# Patient Record
Sex: Female | Born: 1955 | Race: White | Hispanic: No | Marital: Married | Smoking: Former smoker
Health system: Southern US, Community
[De-identification: ages and names within clinical notes are randomized; demographics above are authoritative.]

## PROBLEM LIST (undated history)

## (undated) DIAGNOSIS — E739 Lactose intolerance, unspecified: Secondary | ICD-10-CM

## (undated) DIAGNOSIS — R896 Abnormal cytological findings in specimens from other organs, systems and tissues: Secondary | ICD-10-CM

## (undated) DIAGNOSIS — M810 Age-related osteoporosis without current pathological fracture: Secondary | ICD-10-CM

## (undated) DIAGNOSIS — IMO0001 Reserved for inherently not codable concepts without codable children: Secondary | ICD-10-CM

## (undated) DIAGNOSIS — E079 Disorder of thyroid, unspecified: Secondary | ICD-10-CM

## (undated) DIAGNOSIS — R42 Dizziness and giddiness: Secondary | ICD-10-CM

## (undated) DIAGNOSIS — J329 Chronic sinusitis, unspecified: Secondary | ICD-10-CM

## (undated) HISTORY — PX: DILATION AND CURETTAGE OF UTERUS: SHX78

## (undated) HISTORY — DX: Dizziness and giddiness: R42

## (undated) HISTORY — DX: Abnormal cytological findings in specimens from other organs, systems and tissues: R89.6

## (undated) HISTORY — DX: Disorder of thyroid, unspecified: E07.9

## (undated) HISTORY — DX: Reserved for inherently not codable concepts without codable children: IMO0001

## (undated) HISTORY — DX: Lactose intolerance, unspecified: E73.9

## (undated) HISTORY — PX: NASAL SINUS SURGERY: SHX719

## (undated) HISTORY — PX: BUNIONECTOMY: SHX129

## (undated) HISTORY — DX: Age-related osteoporosis without current pathological fracture: M81.0

## (undated) HISTORY — PX: HYSTEROSCOPY: SHX211

## (undated) HISTORY — DX: Chronic sinusitis, unspecified: J32.9

## (undated) HISTORY — PX: PELVIC LAPAROSCOPY: SHX162

---

## 2000-04-07 ENCOUNTER — Encounter: Admission: RE | Admit: 2000-04-07 | Discharge: 2000-04-07 | Payer: Self-pay | Admitting: Internal Medicine

## 2000-04-07 ENCOUNTER — Encounter: Payer: Self-pay | Admitting: Internal Medicine

## 2001-04-07 ENCOUNTER — Encounter: Admission: RE | Admit: 2001-04-07 | Discharge: 2001-04-07 | Payer: Self-pay | Admitting: Internal Medicine

## 2001-04-07 ENCOUNTER — Encounter: Payer: Self-pay | Admitting: Internal Medicine

## 2001-04-09 ENCOUNTER — Encounter: Admission: RE | Admit: 2001-04-09 | Discharge: 2001-04-09 | Payer: Self-pay | Admitting: Internal Medicine

## 2001-04-09 ENCOUNTER — Encounter: Payer: Self-pay | Admitting: Internal Medicine

## 2001-06-03 ENCOUNTER — Observation Stay (HOSPITAL_COMMUNITY): Admission: RE | Admit: 2001-06-03 | Discharge: 2001-06-04 | Payer: Self-pay

## 2001-06-03 ENCOUNTER — Encounter (INDEPENDENT_AMBULATORY_CARE_PROVIDER_SITE_OTHER): Payer: Self-pay | Admitting: Specialist

## 2002-04-05 ENCOUNTER — Encounter (INDEPENDENT_AMBULATORY_CARE_PROVIDER_SITE_OTHER): Payer: Self-pay

## 2002-04-05 ENCOUNTER — Ambulatory Visit (HOSPITAL_COMMUNITY): Admission: RE | Admit: 2002-04-05 | Discharge: 2002-04-05 | Payer: Self-pay | Admitting: Gastroenterology

## 2002-05-02 ENCOUNTER — Encounter: Admission: RE | Admit: 2002-05-02 | Discharge: 2002-05-02 | Payer: Self-pay | Admitting: Internal Medicine

## 2002-05-02 ENCOUNTER — Encounter: Payer: Self-pay | Admitting: Internal Medicine

## 2002-05-03 ENCOUNTER — Other Ambulatory Visit: Admission: RE | Admit: 2002-05-03 | Discharge: 2002-05-03 | Payer: Self-pay | Admitting: Obstetrics and Gynecology

## 2003-05-03 ENCOUNTER — Encounter: Admission: RE | Admit: 2003-05-03 | Discharge: 2003-05-03 | Payer: Self-pay | Admitting: Internal Medicine

## 2003-05-03 ENCOUNTER — Encounter: Payer: Self-pay | Admitting: Internal Medicine

## 2003-06-09 ENCOUNTER — Other Ambulatory Visit: Admission: RE | Admit: 2003-06-09 | Discharge: 2003-06-09 | Payer: Self-pay | Admitting: Obstetrics and Gynecology

## 2004-05-08 ENCOUNTER — Encounter: Admission: RE | Admit: 2004-05-08 | Discharge: 2004-05-08 | Payer: Self-pay | Admitting: Obstetrics and Gynecology

## 2004-06-12 ENCOUNTER — Other Ambulatory Visit: Admission: RE | Admit: 2004-06-12 | Discharge: 2004-06-12 | Payer: Self-pay | Admitting: Obstetrics and Gynecology

## 2005-05-13 ENCOUNTER — Encounter: Admission: RE | Admit: 2005-05-13 | Discharge: 2005-05-13 | Payer: Self-pay | Admitting: Obstetrics and Gynecology

## 2005-07-02 ENCOUNTER — Other Ambulatory Visit: Admission: RE | Admit: 2005-07-02 | Discharge: 2005-07-02 | Payer: Self-pay | Admitting: Obstetrics and Gynecology

## 2006-05-14 ENCOUNTER — Encounter: Admission: RE | Admit: 2006-05-14 | Discharge: 2006-05-14 | Payer: Self-pay | Admitting: Obstetrics and Gynecology

## 2006-07-08 ENCOUNTER — Other Ambulatory Visit: Admission: RE | Admit: 2006-07-08 | Discharge: 2006-07-08 | Payer: Self-pay | Admitting: Obstetrics and Gynecology

## 2007-05-25 ENCOUNTER — Encounter: Admission: RE | Admit: 2007-05-25 | Discharge: 2007-05-25 | Payer: Self-pay | Admitting: Obstetrics and Gynecology

## 2007-07-15 ENCOUNTER — Other Ambulatory Visit: Admission: RE | Admit: 2007-07-15 | Discharge: 2007-07-15 | Payer: Self-pay | Admitting: Obstetrics and Gynecology

## 2007-08-19 ENCOUNTER — Other Ambulatory Visit: Admission: RE | Admit: 2007-08-19 | Discharge: 2007-08-19 | Payer: Self-pay | Admitting: Obstetrics and Gynecology

## 2008-01-07 ENCOUNTER — Other Ambulatory Visit: Admission: RE | Admit: 2008-01-07 | Discharge: 2008-01-07 | Payer: Self-pay | Admitting: Obstetrics and Gynecology

## 2008-05-25 ENCOUNTER — Encounter: Admission: RE | Admit: 2008-05-25 | Discharge: 2008-05-25 | Payer: Self-pay | Admitting: Obstetrics and Gynecology

## 2008-07-20 ENCOUNTER — Ambulatory Visit: Payer: Self-pay | Admitting: Obstetrics and Gynecology

## 2008-07-20 ENCOUNTER — Other Ambulatory Visit: Admission: RE | Admit: 2008-07-20 | Discharge: 2008-07-20 | Payer: Self-pay | Admitting: Obstetrics and Gynecology

## 2008-07-20 ENCOUNTER — Encounter: Payer: Self-pay | Admitting: Obstetrics and Gynecology

## 2008-08-08 ENCOUNTER — Ambulatory Visit: Payer: Self-pay | Admitting: Obstetrics and Gynecology

## 2009-05-28 ENCOUNTER — Encounter: Admission: RE | Admit: 2009-05-28 | Discharge: 2009-05-28 | Payer: Self-pay | Admitting: Obstetrics and Gynecology

## 2009-08-02 ENCOUNTER — Other Ambulatory Visit: Admission: RE | Admit: 2009-08-02 | Discharge: 2009-08-02 | Payer: Self-pay | Admitting: Obstetrics and Gynecology

## 2009-08-02 ENCOUNTER — Ambulatory Visit: Payer: Self-pay | Admitting: Obstetrics and Gynecology

## 2009-11-30 ENCOUNTER — Ambulatory Visit: Payer: Self-pay | Admitting: Obstetrics and Gynecology

## 2010-05-30 ENCOUNTER — Encounter: Admission: RE | Admit: 2010-05-30 | Discharge: 2010-05-30 | Payer: Self-pay | Admitting: Obstetrics and Gynecology

## 2010-08-07 ENCOUNTER — Other Ambulatory Visit: Admission: RE | Admit: 2010-08-07 | Discharge: 2010-08-07 | Payer: Self-pay | Admitting: Obstetrics and Gynecology

## 2010-08-07 ENCOUNTER — Ambulatory Visit: Payer: Self-pay | Admitting: Obstetrics and Gynecology

## 2010-10-10 ENCOUNTER — Ambulatory Visit: Payer: Self-pay | Admitting: Obstetrics and Gynecology

## 2010-11-22 ENCOUNTER — Ambulatory Visit
Admission: RE | Admit: 2010-11-22 | Discharge: 2010-11-22 | Payer: Self-pay | Source: Home / Self Care | Attending: Obstetrics and Gynecology | Admitting: Obstetrics and Gynecology

## 2011-03-14 NOTE — Op Note (Signed)
[facility]  Patient:    Carol Mata, Carol Mata                     MRN: [id number] Proc. Date: 06/03/01 Adm. Date:  [id number] Attending:  Sharon Mt                           Operative Report  PREOPERATIVE DIAGNOSES:       1. Right adnexal mass.                               2. Endometrial polyps.  POSTOPERATIVE DIAGNOSES:      1. Cyst of right fallopian tube.                               2. Endometrial polyp.  OPERATION:                    Diagnostic laparoscopy with right                               salpingectomy followed by hysteroscopy,                               dilatation and curettage.  SURGEON:                      Daniel L. Eda Paschal, M.D.  ASSISTANTGaetano Hawthorne. Lily Peer, M.D.  ANESTHESIA:                   General endotracheal.  INDICATIONS:                  The patient is a 55 year old female who went to her internist for a yearly exam and because of her concern about ovarian cancer, went ahead and had an ultrasound done. This was done on June 12. It was read by Dr. Gery Pray as an irregular soft tissue mass emanating from the right ovary surrounded by fluid with internal blood flow present. He was concerned about a malignancy of the ovary. Several days later, she had to return to him for a mammogram and he went ahead re-ultrasounded her. At this time, he felt that it looked more like it was her right fallopian tube. It looked like she probably had a hydrosalpinx or salpingitis but what was disconcerting was that there was both solid and cystic components to it. She was treated with two weeks of antibiotics. She underwent two additional scans, one on July 12, one on August 5, and she continued to have an enlarged mass on the right side of 36 x 8 x 11 mm, increased vascular flow. The mass was near the distal end of what appeared to be a fallopian tube with fingerlike projections. It clearly was significantly  enlarged from a normal fallopian tube and because it was not a pure hydrosalpinx filled with fluid but had solid-like projections, it was felt that it should be removed. As a result of that, she now enters the hospital for diagnostic laparoscopy with either right salpingectomy or right salpingo-oophorectomy. She has had an  endometrial polyp seen on ultrasound on two occasions and she will also undergo hysteroscopy and excision of these endometrial polyp or polyps.  FINDINGS:                     At the time of laparoscopy, the patients uterus was normal size and shape. Left fallopian tube and ovary were normal. Right ovary was normal. The right fallopian tube appeared to be somewhat distended near the distal end although to the surgeons eyes, it was not anywhere near as impressive as it had been on the four ultrasounds that she had undergone. Ileocecal junction was identified. Appendix was normal. Right upper quadrant was visualized and was likewise normal. Once the fallopian tube had been removed, there was a cyst within the distal end of the tube which appeared to be starting to collapse. There were also some white excrescences near the fimbria of the right fallopian tube. At the time of hysteroscopy, the patient had two endometrial polyps, both were approximately 1 cm. They came off the right and left lateral wall. After they had been removed, the top of fundus, tubal cornua, anterior and posterior walls of the fundus, lower uterine segment, and the cervical canal were normal.  DESCRIPTION OF PROCEDURE:     After adequate general endotracheal anesthesia, the patient was placed in the dorsal lithotomy position, prepped and draped in the usual sterile manner. A pneumoperitoneum was created subumbilically with the ______ needle. A 10 mm port was placed there. The operating laparoscope was placed through the port. Two 5 mm ports were placed in the pelvis, one in the midline and one in the  left lower quadrant. Copious peritoneal washings were obtained, and then the pelvis was systematically evaluated. Both surgeons were at least somewhat puzzled by no obvious pelvic mass on the right which corresponded to what we were seeing on ultrasound, but since everything appeared normal, except for the slight bulge of the tube, and the fact that on ultrasound reports the mass had always been between the ovary and the uterus, it was felt that a right salpingectomy should be done. This was done using bipolar coagulation to control bleeding in the mesosalpinx and a scalpel attached to the bipolar to cut. The tube was completely excised. It was removed through the subumbilical incision and sent for frozen section. Frozen section confirmed that there was a cyst in the distal portion of the tube which explained all the above, and it was benign. It appeared to be slightly collapsed and whether that had happened during the surgery or was happening anyway, was hard to be sure. Everything appeared to be benign. The laparoscopic procedure was therefore terminated. All cul-de-sac fluid was removed. The pneumoperitoneum was evacuated. The subumbilical incision was closed with 0 Vicryl. The three skin incisions were closed with Monocryl. The surgeon regloved and draped and attention was turned to the vagina.  A single-tooth tenaculum was placed on the anterior lip of the cervix. The cervix was dilated to a #29 Pratt dilator. A hysteroscopic examination was done with the diagnostic scope using 3% Sorbitol to expand the intrauterine cavity. Two polyps were identified. They were removed with sharp curettage along with some endometrium, and this was also sent to pathology for tissue diagnosis. The patient was re-hysteroscoped and all the polyps had been removed. The procedure was therefore terminated. Pictures were taken both laparoscopically and hysteroscopically for documentation. Fluid deficit  from the hysteroscopy was 40 cc with none replaced. The patient tolerated  the procedure well and left the operating room in satisfactory condition. It should be noted that prior to starting the laparoscopic procedure, her bladder  had been emptied with a Robinson catheter. DD:  06/03/01 TD:  06/04/01 Job: [id number]

## 2011-05-01 ENCOUNTER — Other Ambulatory Visit: Payer: Self-pay | Admitting: Obstetrics and Gynecology

## 2011-05-01 DIAGNOSIS — Z1231 Encounter for screening mammogram for malignant neoplasm of breast: Secondary | ICD-10-CM

## 2011-05-22 ENCOUNTER — Other Ambulatory Visit (INDEPENDENT_AMBULATORY_CARE_PROVIDER_SITE_OTHER): Payer: BC Managed Care – PPO | Admitting: Gynecology

## 2011-05-22 DIAGNOSIS — E559 Vitamin D deficiency, unspecified: Secondary | ICD-10-CM

## 2011-05-23 LAB — VITAMIN D 25 HYDROXY (VIT D DEFICIENCY, FRACTURES): Vit D, 25-Hydroxy: 29 ng/mL — ABNORMAL LOW (ref 30–89)

## 2011-06-03 ENCOUNTER — Ambulatory Visit
Admission: RE | Admit: 2011-06-03 | Discharge: 2011-06-03 | Disposition: A | Discharge status: Home / Self Care | Payer: BC Managed Care – PPO | Source: Ambulatory Visit | Attending: Obstetrics and Gynecology | Admitting: Obstetrics and Gynecology

## 2011-06-03 DIAGNOSIS — Z1231 Encounter for screening mammogram for malignant neoplasm of breast: Secondary | ICD-10-CM

## 2011-08-12 ENCOUNTER — Encounter: Payer: Self-pay | Admitting: Gynecology

## 2011-08-12 DIAGNOSIS — N952 Postmenopausal atrophic vaginitis: Secondary | ICD-10-CM | POA: Insufficient documentation

## 2011-08-12 DIAGNOSIS — IMO0001 Reserved for inherently not codable concepts without codable children: Secondary | ICD-10-CM | POA: Insufficient documentation

## 2011-08-12 DIAGNOSIS — E079 Disorder of thyroid, unspecified: Secondary | ICD-10-CM | POA: Insufficient documentation

## 2011-08-21 ENCOUNTER — Ambulatory Visit (INDEPENDENT_AMBULATORY_CARE_PROVIDER_SITE_OTHER): Payer: BC Managed Care – PPO | Admitting: Obstetrics and Gynecology

## 2011-08-21 ENCOUNTER — Other Ambulatory Visit (HOSPITAL_COMMUNITY)
Admission: RE | Admit: 2011-08-21 | Discharge: 2011-08-21 | Disposition: A | Discharge status: Home / Self Care | Payer: BC Managed Care – PPO | Source: Ambulatory Visit | Attending: Obstetrics and Gynecology | Admitting: Obstetrics and Gynecology

## 2011-08-21 ENCOUNTER — Encounter: Payer: Self-pay | Admitting: Obstetrics and Gynecology

## 2011-08-21 VITALS — BP 112/64 | Ht 67.0 in | Wt 119.0 lb

## 2011-08-21 DIAGNOSIS — Z01419 Encounter for gynecological examination (general) (routine) without abnormal findings: Secondary | ICD-10-CM

## 2011-08-21 DIAGNOSIS — M949 Disorder of cartilage, unspecified: Secondary | ICD-10-CM

## 2011-08-21 DIAGNOSIS — M8580 Other specified disorders of bone density and structure, unspecified site: Secondary | ICD-10-CM

## 2011-08-21 DIAGNOSIS — Z13220 Encounter for screening for lipoid disorders: Secondary | ICD-10-CM

## 2011-08-21 DIAGNOSIS — E039 Hypothyroidism, unspecified: Secondary | ICD-10-CM

## 2011-08-21 DIAGNOSIS — M899 Disorder of bone, unspecified: Secondary | ICD-10-CM

## 2011-08-21 DIAGNOSIS — R823 Hemoglobinuria: Secondary | ICD-10-CM

## 2011-08-21 MED ORDER — LEVOTHYROXINE SODIUM 50 MCG PO TABS: 50.0000 ug | ORAL_TABLET | Freq: Every day | ORAL | Status: DC

## 2011-08-21 NOTE — Progress Notes (Signed)
Patient came to see me today for her annual GYN exam. She is doing well after menopause without symptoms. She's had no vaginal bleeding and no pelvic pain. She is up-to-date on mammograms. Her last bone density was in January of 2012. She continues with low bone mass without an elevated FRAX risk. Her vitamin D level was very low at 14 and we had placed her on 1000 IUs of vitamin D daily. Although she doesn't take it religiously her vitamin D level was 29 in July. We are also treating her for hypothyroidism. She feels normal on 50 MCG's of Synthroid. Her TSH last year with borderline between 5 and 6. She elected however not increase her dose. She also was treated with Vagifem previously. She no longer takes it and feels she is fine without it.  ROS: 12 system review done. Pertinent positives above.  Physical examination: [name] present. HEENT within normal limits. Neck: Thyroid not large. No masses. Supraclavicular nodes: not enlarged. Breasts: Examined in both sitting midline position. No skin changes and no masses. Abdomen: Soft no guarding rebound or masses or hernia. Pelvic: External: Within normal limits. BUS: Within normal limits. Vaginal:within normal limits. Poor estrogen effect. No evidence of cystocele rectocele or enterocele. Cervix: clean. Uterus: Normal size and shape. Adnexa: No masses. Rectovaginal exam: Confirmatory and negative. Extremities: Within normal limits.  Assessment: #1. Hypothyroidism #2. Atrophic vaginitis #3. Osteopenia #4. Vitamin D deficiency  Plan: If TSH stable we'll not increase Synthroid. Patient will take her vitamin D faithfully. We will recheck her vitamin D in one year.

## 2011-09-15 ENCOUNTER — Other Ambulatory Visit: Payer: Self-pay

## 2011-09-15 ENCOUNTER — Telehealth: Payer: Self-pay

## 2011-09-15 MED ORDER — FLUTICASONE PROPIONATE 50 MCG/ACT NA SUSP: 2.0000 | NASAL | Status: DC | PRN

## 2011-09-15 NOTE — Telephone Encounter (Signed)
PT. REQUESTING A NOTE FROM YOU THAT SHE HAD HER PHYSICAL WITH YOU ON 08-21-11. IT ID REQUIRED BY HER INS. CO. AND WANT SUS TO FAX IT TO HER AT [phone].

## 2011-09-15 NOTE — Telephone Encounter (Signed)
Please write note and I will sign. Thank you.

## 2011-09-15 NOTE — Telephone Encounter (Signed)
KATHY DID LETTER, PRINTED IT AND I FAXED TO # BELOW AND NOTIFIED PT.

## 2011-11-11 ENCOUNTER — Other Ambulatory Visit: Payer: Self-pay | Admitting: *Deleted

## 2011-11-11 MED ORDER — ZOLPIDEM TARTRATE 5 MG PO TABS: 5.0000 mg | ORAL_TABLET | Freq: Every evening | ORAL | Status: DC | PRN

## 2011-11-11 NOTE — Telephone Encounter (Signed)
rx called in

## 2011-12-26 ENCOUNTER — Telehealth: Payer: Self-pay | Admitting: *Deleted

## 2011-12-26 NOTE — Telephone Encounter (Signed)
Pt called requesting for her aex labs and date to fill out her health assessment form for her job. Results given over the phone to pt.

## 2012-05-07 ENCOUNTER — Telehealth: Payer: Self-pay | Admitting: *Deleted

## 2012-05-07 MED ORDER — ZOLPIDEM TARTRATE 5 MG PO TABS: 5.0000 mg | ORAL_TABLET | Freq: Every evening | ORAL | Status: DC | PRN

## 2012-05-14 ENCOUNTER — Other Ambulatory Visit: Payer: Self-pay | Admitting: Obstetrics and Gynecology

## 2012-05-14 DIAGNOSIS — Z1231 Encounter for screening mammogram for malignant neoplasm of breast: Secondary | ICD-10-CM

## 2012-06-04 ENCOUNTER — Ambulatory Visit
Admission: RE | Admit: 2012-06-04 | Discharge: 2012-06-04 | Disposition: A | Discharge status: Home / Self Care | Payer: BC Managed Care – PPO | Source: Ambulatory Visit | Attending: Obstetrics and Gynecology | Admitting: Obstetrics and Gynecology

## 2012-06-04 DIAGNOSIS — Z1231 Encounter for screening mammogram for malignant neoplasm of breast: Secondary | ICD-10-CM

## 2012-06-30 ENCOUNTER — Other Ambulatory Visit: Payer: Self-pay | Admitting: *Deleted

## 2012-07-01 MED ORDER — ZOLPIDEM TARTRATE 5 MG PO TABS: 5.0000 mg | ORAL_TABLET | Freq: Every evening | ORAL | Status: DC | PRN

## 2012-08-23 ENCOUNTER — Encounter: Payer: BC Managed Care – PPO | Admitting: Obstetrics and Gynecology

## 2012-08-24 ENCOUNTER — Other Ambulatory Visit (HOSPITAL_COMMUNITY)
Admission: RE | Admit: 2012-08-24 | Discharge: 2012-08-24 | Disposition: A | Discharge status: Home / Self Care | Payer: BC Managed Care – PPO | Source: Ambulatory Visit | Attending: Obstetrics and Gynecology | Admitting: Obstetrics and Gynecology

## 2012-08-24 ENCOUNTER — Ambulatory Visit (INDEPENDENT_AMBULATORY_CARE_PROVIDER_SITE_OTHER): Payer: BC Managed Care – PPO | Admitting: Obstetrics and Gynecology

## 2012-08-24 ENCOUNTER — Encounter: Payer: Self-pay | Admitting: Obstetrics and Gynecology

## 2012-08-24 VITALS — BP 110/70 | Ht 67.0 in | Wt 120.0 lb

## 2012-08-24 DIAGNOSIS — Z01419 Encounter for gynecological examination (general) (routine) without abnormal findings: Secondary | ICD-10-CM

## 2012-08-24 DIAGNOSIS — R42 Dizziness and giddiness: Secondary | ICD-10-CM | POA: Insufficient documentation

## 2012-08-24 DIAGNOSIS — M8580 Other specified disorders of bone density and structure, unspecified site: Secondary | ICD-10-CM

## 2012-08-24 DIAGNOSIS — M949 Disorder of cartilage, unspecified: Secondary | ICD-10-CM

## 2012-08-24 DIAGNOSIS — E039 Hypothyroidism, unspecified: Secondary | ICD-10-CM

## 2012-08-24 DIAGNOSIS — M899 Disorder of bone, unspecified: Secondary | ICD-10-CM

## 2012-08-24 LAB — CBC WITH DIFFERENTIAL/PLATELET
Basophils Relative: 0 % (ref 0–1)
Eosinophils Relative: 0 % (ref 0–5)
Hemoglobin: 13.3 g/dL (ref 12.0–15.0)
MCH: 29.4 pg (ref 26.0–34.0)
Monocytes Relative: 9 % (ref 3–12)
RBC: 4.53 MIL/uL (ref 3.87–5.11)
WBC: 4.8 10*3/uL (ref 4.0–10.5)

## 2012-08-24 MED ORDER — FLUTICASONE PROPIONATE 50 MCG/ACT NA SUSP: 2.0000 | NASAL | Status: DC | PRN

## 2012-08-24 MED ORDER — LEVOTHYROXINE SODIUM 50 MCG PO TABS: 50.0000 ug | ORAL_TABLET | Freq: Every day | ORAL | Status: DC

## 2012-08-24 NOTE — Patient Instructions (Signed)
Scheduled bone density for 2014 in January. Continue yearly mammograms. Continue colonoscopies as per Dr. Kinnie Scales.

## 2012-08-24 NOTE — Progress Notes (Signed)
Patient came to see me today for her annual GYN exam. She is doing well without hormone replacement therapy. For a while she used  Vagifem for atrophic vaginitis. She stopped it and the problem has not reoccurred. She is having no vaginal bleeding. She is having no pelvic pain. In 2002 she had a diagnostic laparoscopy with right salpingectomy for right paratubal cyst which was benign. At the same time she had a hysteroscopy with removal of an endometrial polyp. Most of her life she had normal Pap smears. In 2008 she had 2 Pap smears which showed ascus and high-risk HPV typing was negative. Colposcopy with biopsy was negative. Since then she has had yearly normal Pap smears including 2012.She is due for yearly mammogram. Her last bone density was January, 2012. She had osteopenia without an elevated fracture risk. She has had no fractures. A vitamin D level came back at 29. She is not taking vitamin D. She has a high calcium diet. She is lactose intolerant. We are treating her for hypothyroidism and she is euthyroid on her current dose of Synthroid according to symptomatology. Patient has had 2 normal colonoscopies and Dr. Kinnie Scales now has her on 10 year followup.  Physical examination:Kim Julian Reil present. HEENT within normal limits. Neck: Thyroid not large. No masses. Supraclavicular nodes: not enlarged. Breasts: Examined in both sitting and lying  position. No skin changes and no masses. Abdomen: Soft no guarding rebound or masses or hernia. Pelvic: External: Within normal limits. BUS: Within normal limits. Vaginal:within normal limits. Good estrogen effect. No evidence of cystocele rectocele or enterocele. Cervix: clean. Uterus: Normal size and shape. Adnexa: No masses. Rectovaginal exam: Confirmatory and negative. Extremities: Within normal limits.  Assessment: #1. Hypothyroid #2. History of ascus without high risk HPV-now resolved #3. Osteopenia #4. Atrophic vaginitis-now resolved  Plan: Vitamin D  1000 IUs daily. Mammogram. Bone density in January 2014.The new Pap smear guidelines were discussed with the patient. Patient requested pap and was done.

## 2012-08-25 ENCOUNTER — Other Ambulatory Visit: Payer: Self-pay | Admitting: Obstetrics and Gynecology

## 2012-08-25 DIAGNOSIS — E559 Vitamin D deficiency, unspecified: Secondary | ICD-10-CM

## 2012-08-25 LAB — COMPREHENSIVE METABOLIC PANEL
ALT: 14 U/L (ref 0–35)
AST: 23 U/L (ref 0–37)
Alkaline Phosphatase: 67 U/L (ref 39–117)
Glucose, Bld: 78 mg/dL (ref 70–99)
Total Bilirubin: 0.9 mg/dL (ref 0.3–1.2)

## 2012-08-25 LAB — VITAMIN D 25 HYDROXY (VIT D DEFICIENCY, FRACTURES): Vit D, 25-Hydroxy: 30 ng/mL (ref 30–89)

## 2012-08-25 LAB — LIPID PANEL
Cholesterol: 186 mg/dL (ref 0–200)
HDL: 75 mg/dL (ref >39)
Total CHOL/HDL Ratio: 2.5 Ratio
Triglycerides: 83 mg/dL (ref <150)

## 2012-08-25 LAB — URINALYSIS W MICROSCOPIC + REFLEX CULTURE
Bacteria, UA: NONE SEEN
Casts: NONE SEEN
Glucose, UA: NEGATIVE mg/dL
Nitrite: NEGATIVE
Specific Gravity, Urine: 1.024 (ref 1.005–1.030)

## 2012-08-26 LAB — URINE CULTURE
Colony Count: NO GROWTH
Organism ID, Bacteria: NO GROWTH

## 2012-08-27 ENCOUNTER — Encounter: Payer: Self-pay | Admitting: Obstetrics and Gynecology

## 2012-08-30 ENCOUNTER — Other Ambulatory Visit: Payer: Self-pay | Admitting: Obstetrics and Gynecology

## 2012-08-31 ENCOUNTER — Encounter: Payer: Self-pay | Admitting: *Deleted

## 2012-08-31 ENCOUNTER — Telehealth: Payer: Self-pay | Admitting: *Deleted

## 2012-08-31 NOTE — Telephone Encounter (Signed)
Pt called requesting a letter to send to her insurance company stating that she had her annual visit on 08/24/12. Letter will be faxed to patient. [phone]. Per her request.

## 2012-11-15 ENCOUNTER — Other Ambulatory Visit: Payer: Self-pay | Admitting: Gynecology

## 2012-11-15 DIAGNOSIS — M8580 Other specified disorders of bone density and structure, unspecified site: Secondary | ICD-10-CM

## 2013-01-20 ENCOUNTER — Ambulatory Visit (INDEPENDENT_AMBULATORY_CARE_PROVIDER_SITE_OTHER): Payer: 59

## 2013-01-20 DIAGNOSIS — M810 Age-related osteoporosis without current pathological fracture: Secondary | ICD-10-CM

## 2013-01-20 DIAGNOSIS — M8580 Other specified disorders of bone density and structure, unspecified site: Secondary | ICD-10-CM

## 2013-01-21 ENCOUNTER — Other Ambulatory Visit: Payer: 59

## 2013-01-21 ENCOUNTER — Telehealth: Payer: Self-pay | Admitting: Gynecology

## 2013-01-21 ENCOUNTER — Encounter: Payer: Self-pay | Admitting: Gynecology

## 2013-01-21 DIAGNOSIS — M810 Age-related osteoporosis without current pathological fracture: Secondary | ICD-10-CM

## 2013-01-21 NOTE — Telephone Encounter (Signed)
Pt informed with the below note, orders placed, transferred to front desk. 

## 2013-01-21 NOTE — Telephone Encounter (Signed)
Tell patient that her bone density shows osteoporosis with continued loss of bone from her prior studies. She needs to schedule an office visit to discuss treatment options. I also recommend before the office visit she have drawn a PTH, TSH, vitamin D.

## 2013-01-24 ENCOUNTER — Encounter: Payer: Self-pay | Admitting: Gynecology

## 2013-01-24 ENCOUNTER — Ambulatory Visit (INDEPENDENT_AMBULATORY_CARE_PROVIDER_SITE_OTHER): Payer: 59 | Admitting: Gynecology

## 2013-01-24 DIAGNOSIS — M810 Age-related osteoporosis without current pathological fracture: Secondary | ICD-10-CM

## 2013-01-24 LAB — PTH, INTACT AND CALCIUM: Calcium, Total (PTH): 9.6 mg/dL (ref 8.4–10.5)

## 2013-01-24 MED ORDER — ERGOCALCIFEROL 1.25 MG (50000 UT) PO CAPS: 50000.0000 [IU] | ORAL_CAPSULE | ORAL | Status: DC

## 2013-01-24 MED ORDER — ALENDRONATE SODIUM 70 MG PO TABS: 70.0000 mg | ORAL_TABLET | ORAL | Status: DC

## 2013-01-24 NOTE — Progress Notes (Signed)
Patient presents with her husband to discuss her most recent bone density showing osteoporosis T score -2.5 at the AP spine. Has had progressive loss over the last several scans. Does have difficulty taking calcium. Most recent vitamin D 12, TSH normal, PTH pending. I reviewed with the patient and her husband issues of osteoporosis and long-term fracture risk. Options for management reviewed to include increasing weightbearing exercise, increasing calcium and vitamin D in her diet. Pharmacologic treatment to include bisphosphonates, Prolia, Evista, Forteo reviewed. I discussed the pros/cons, risks/benefits of each choice. After lengthy discussion we will have a trial of alendronate 70 mg weekly. Risks to include osteonecrosis of the jaw, atypical fractures particularly with prolonged use, GERD, esophagitis, esophageal cancer risks all reviewed. Drug-free holiday options discussed. We'll recheck DEXA in 2 years. Vitamin D 50,000 units weekly x12 with recheck a vitamin D level. If appropriate then 2000 units daily maintenance dose all reviewed with her. Patient's questions were answered and she will go ahead and start the alendronate and vitamin D 50,000 units weekly.

## 2013-01-24 NOTE — Patient Instructions (Addendum)
Take vitamin D tablets 50,000 units once weekly for 12 weeks. Recheck vitamin D level at that time. Take alendronate 70 mg once weekly as prescribed. Report any issues. Osteoporosis Throughout your life, your body breaks down old bone and replaces it with new bone. As you get older, your body does not replace bone as quickly as it breaks it down. By the age of 30 years, most people begin to gradually lose bone because of the imbalance between bone loss and replacement. Some people lose more bone than others. Bone loss beyond a specified normal degree is considered osteoporosis.  Osteoporosis affects the strength and durability of your bones. The inside of the ends of your bones and your flat bones, like the bones of your pelvis, look like honeycomb, filled with tiny open spaces. As bone loss occurs, your bones become less dense. This means that the open spaces inside your bones become bigger and the walls between these spaces become thinner. This makes your bones weaker. Bones of a person with osteoporosis can become so weak that they can break (fracture) during minor accidents, such as a simple fall. CAUSES  The following factors have been associated with the development of osteoporosis:  Smoking.  Drinking more than 2 alcoholic drinks several days per week.  Long-term use of certain medicines:  Corticosteroids.  Chemotherapy medicines.  Thyroid medicines.  Antiepileptic medicines.  Gonadal hormone suppression medicine.  Immunosuppression medicine.  Being underweight.  Lack of physical activity.  Lack of exposure to the sun. This can lead to vitamin D deficiency.  Certain medical conditions:  Certain inflammatory bowel diseases, such as Crohn's disease and ulcerative colitis.  Diabetes.  Hyperthyroidism.  Hyperparathyroidism. RISK FACTORS Anyone can develop osteoporosis. However, the following factors can increase your risk of developing osteoporosis:  Gender Women are  at higher risk than men.  Age Being older than 50 years increases your risk.  Ethnicity White and Asian people have an increased risk.  Weight Being extremely underweight can increase your risk of osteoporosis.  Family history of osteoporosis Having a family member who has developed osteoporosis can increase your risk. SYMPTOMS  Usually, people with osteoporosis have no symptoms.  DIAGNOSIS  Signs during a physical exam that may prompt your caregiver to suspect osteoporosis include:  Decreased height. This is usually caused by the compression of the bones that form your spine (vertebrae) because they have weakened and become fractured.  A curving or rounding of the upper back (kyphosis). To confirm signs of osteoporosis, your caregiver may request a procedure that uses 2 low-dose X-ray beams with different levels of energy to measure your bone mineral density (dual-energy X-ray absorptiometry [DXA]). Also, your caregiver may check your level of vitamin D. TREATMENT  The goal of osteoporosis treatment is to strengthen bones in order to decrease the risk of bone fractures. There are different types of medicines available to help achieve this goal. Some of these medicines work by slowing the processes of bone loss. Some medicines work by increasing bone density. Treatment also involves making sure that your levels of calcium and vitamin D are adequate. PREVENTION  There are things you can do to help prevent osteoporosis. Adequate intake of calcium and vitamin D can help you achieve optimal bone mineral density. Regular exercise can also help, especially resistance and high-impact activities. If you smoke, quitting smoking is an important part of osteoporosis prevention. MAKE SURE YOU:  Understand these instructions.  Will watch your condition.  Will get help right away if  you are not doing well or get worse. Document Released: 07/23/2005 Document Revised: 01/05/2012 Document Reviewed:  09/27/2011 [facility] Patient Information 2013 [place], [place].

## 2013-02-16 ENCOUNTER — Telehealth: Payer: Self-pay | Admitting: *Deleted

## 2013-02-16 NOTE — Telephone Encounter (Signed)
Pt informed with the below note, pt asked if you think the temazepam is bad for her to take?

## 2013-02-16 NOTE — Telephone Encounter (Signed)
(  Pt is aware you are out of the office) pt use to be on Ambien 5 mg cutting pill in half  giving by Dr.G for 2 yrs she was taking medication nightly, she stopped due to being on medication for years. Her problems with falling alseep went away, but now has returned. Pt said has been taking her husband pill temazepam 30 mg tablet cutting this in half taking every other night. She asked if you think she should start back on Ambien? Or it trying maybe OTC medication would help? Pt would like you recommendations, please advise

## 2013-02-16 NOTE — Telephone Encounter (Signed)
Benadryl 25 mg is what is in most otc meds.  Can try that.  If doesn't work let me know and I can prescribe [employer]

## 2013-02-20 NOTE — Telephone Encounter (Signed)
It is a medication I do not use for sleep aid routinely.  It is in the Valium family and as a rule I avoid those for long term use.

## 2013-02-21 MED ORDER — ZOLPIDEM TARTRATE 5 MG PO TABS: 5.0000 mg | ORAL_TABLET | Freq: Every evening | ORAL | Status: DC | PRN

## 2013-02-21 NOTE — Telephone Encounter (Signed)
Ambien 5 mg #30 refill x2

## 2013-02-21 NOTE — Telephone Encounter (Signed)
Pt informed with the below note, she asked if rx could be sent for Ambien 5 mg.

## 2013-02-21 NOTE — Telephone Encounter (Signed)
rx called in

## 2013-02-21 NOTE — Addendum Note (Signed)
Addended by: Aura Camps on: 02/21/2013 10:53 AM   Modules accepted: Orders

## 2013-04-11 ENCOUNTER — Other Ambulatory Visit: Payer: Self-pay

## 2013-04-11 MED ORDER — ALENDRONATE SODIUM 70 MG PO TABS: 70.0000 mg | ORAL_TABLET | ORAL | Status: DC

## 2013-04-14 ENCOUNTER — Other Ambulatory Visit: Payer: 59

## 2013-04-14 ENCOUNTER — Other Ambulatory Visit: Payer: Self-pay | Admitting: Gynecology

## 2013-04-14 ENCOUNTER — Other Ambulatory Visit: Payer: Self-pay

## 2013-04-14 DIAGNOSIS — E559 Vitamin D deficiency, unspecified: Secondary | ICD-10-CM

## 2013-05-18 ENCOUNTER — Other Ambulatory Visit: Payer: Self-pay

## 2013-05-18 DIAGNOSIS — Z1231 Encounter for screening mammogram for malignant neoplasm of breast: Secondary | ICD-10-CM

## 2013-05-24 ENCOUNTER — Other Ambulatory Visit: Payer: Self-pay | Admitting: Dermatology

## 2013-06-09 ENCOUNTER — Ambulatory Visit
Admission: RE | Admit: 2013-06-09 | Discharge: 2013-06-09 | Disposition: A | Discharge status: Home / Self Care | Payer: 59 | Source: Ambulatory Visit

## 2013-06-09 DIAGNOSIS — Z1231 Encounter for screening mammogram for malignant neoplasm of breast: Secondary | ICD-10-CM

## 2013-06-30 ENCOUNTER — Telehealth: Payer: Self-pay | Admitting: *Deleted

## 2013-06-30 NOTE — Telephone Encounter (Signed)
Pt calling c/o migraine headache has tired OTC migraine medication and no relief. She asked if you would willing to give her a Rx? Please advise

## 2013-06-30 NOTE — Telephone Encounter (Signed)
Pt informed with the below note. 

## 2013-06-30 NOTE — Telephone Encounter (Signed)
If these are new onset headaches, I think she really should be evaluated by a neurologist.  Carol Mata is good.

## 2013-08-26 ENCOUNTER — Ambulatory Visit (INDEPENDENT_AMBULATORY_CARE_PROVIDER_SITE_OTHER): Payer: 59 | Admitting: Gynecology

## 2013-08-26 ENCOUNTER — Encounter: Payer: Self-pay | Admitting: Gynecology

## 2013-08-26 VITALS — BP 120/76 | Ht 67.0 in | Wt 121.0 lb

## 2013-08-26 DIAGNOSIS — Z01419 Encounter for gynecological examination (general) (routine) without abnormal findings: Secondary | ICD-10-CM

## 2013-08-26 DIAGNOSIS — G4700 Insomnia, unspecified: Secondary | ICD-10-CM

## 2013-08-26 DIAGNOSIS — M810 Age-related osteoporosis without current pathological fracture: Secondary | ICD-10-CM

## 2013-08-26 LAB — COMPREHENSIVE METABOLIC PANEL
ALT: 13 U/L (ref 0–35)
AST: 23 U/L (ref 0–37)
BUN: 11 mg/dL (ref 6–23)
Calcium: 9.2 mg/dL (ref 8.4–10.5)
Chloride: 103 mEq/L (ref 96–112)
Creat: 0.71 mg/dL (ref 0.50–1.10)
Total Bilirubin: 0.9 mg/dL (ref 0.3–1.2)

## 2013-08-26 LAB — CBC WITH DIFFERENTIAL/PLATELET
Basophils Absolute: 0 10*3/uL (ref 0.0–0.1)
Eosinophils Absolute: 0 10*3/uL (ref 0.0–0.7)
Eosinophils Relative: 1 % (ref 0–5)
Lymphocytes Relative: 26 % (ref 12–46)
MCV: 87.2 fL (ref 78.0–100.0)
Platelets: 162 10*3/uL (ref 150–400)
RDW: 13.3 % (ref 11.5–15.5)
WBC: 4.4 10*3/uL (ref 4.0–10.5)

## 2013-08-26 LAB — LIPID PANEL
HDL: 69 mg/dL (ref >39)
Total CHOL/HDL Ratio: 2.7 Ratio
Triglycerides: 113 mg/dL (ref <150)
VLDL: 23 mg/dL (ref 0–40)

## 2013-08-26 LAB — TSH: TSH: 4.863 u[IU]/mL — ABNORMAL HIGH (ref 0.350–4.500)

## 2013-08-26 MED ORDER — LEVOTHYROXINE SODIUM 50 MCG PO TABS: 50.0000 ug | ORAL_TABLET | Freq: Every day | ORAL | Status: DC

## 2013-08-26 MED ORDER — ZOLPIDEM TARTRATE 5 MG PO TABS: 5.0000 mg | ORAL_TABLET | Freq: Every evening | ORAL | Status: DC | PRN

## 2013-08-26 MED ORDER — FLUTICASONE PROPIONATE 50 MCG/ACT NA SUSP: 2.0000 | NASAL | Status: DC | PRN

## 2013-08-26 MED ORDER — ALENDRONATE SODIUM 70 MG PO TABS: 70.0000 mg | ORAL_TABLET | ORAL | Status: DC

## 2013-08-26 NOTE — Progress Notes (Signed)
Carol Mata October 09, 1956 [id number]        57 y.o.  [id number] for annual exam.  Patient Carol Mata. Several issues noted below.  Past medical history,surgical history, problem list, medications, allergies, family history and social history were all reviewed and documented in the EPIC chart.  ROS:  Performed and pertinent positives and negatives are included in the history, assessment and plan .  Exam: Carol Mata assistant Filed Vitals:   08/26/13 0847  BP: 120/76  Height: 5\' 7"  (1.702 m)  Weight: 121 lb (54.885 kg)   General appearance  Normal Skin grossly normal Head/Neck normal with no cervical or supraclavicular adenopathy thyroid normal Lungs  clear Cardiac RR, without RMG Abdominal  soft, nontender, without masses, organomegaly or hernia Breasts  examined lying and sitting without masses, retractions, discharge or axillary adenopathy. Pelvic  Ext/BUS/vagina  normal with mild atrophic changes  Cervix  normal  Uterus  anteverted, normal size, shape and contour, midline and mobile nontender   Adnexa  Without masses or tenderness    Anus and perineum  normal   Rectovaginal  normal sphincter tone without palpated masses or tenderness.    Assessment/Plan:  57 y.o. [id number] female for annual exam.   1. Postmenopausal. Doing well without significant hot flushes, night sweats, vaginal dryness, dyspareunia or any vaginal bleeding. Will continue to monitor. Patient knows to report any vaginal bleeding. 2. Osteoporosis. DEXA 12/2012 T score -2.5 Recently started alendronate earlier this year doing well with this. Will repeat DEXA at two-year interval. Increase calcium vitamin D reviewed. Check vitamin D level today. 3. Insomnia. Patient uses one half of 5 mg Ambien at night to help sleep. She's tried doing without but has difficulty falling asleep. We reviewed the whole issue of chronic use of Ambien and addictive properties. Patient's comfortable continuing I refilled her Ambien 5 mg #30 with 4  refills. 4. Hypothyroid. Has been treated by Dr. Eda Mata. Check TSH and refill her Synthroid 50 mcg x1 year. 5. Flonase. Uses intermittently for stuffy nose. Refill provided. 6. Pap smear 2013. No Pap smear done today. No history of significant abnormal Pap smears. Plan repeat at 3 year interval. 7. Mammography 05/2013. Continue with annual mammography. SBE monthly reviewed. 8. Colonoscopy 2009 with planned repeat 2016 per her history. 9. Health maintenance. Baseline CBC comprehensive metabolic panel lipid profile urinalysis TSH and vitamin D ordered. Followup one year, sooner as needed.   Note: This document was prepared with digital dictation and possible smart phrase technology. Any transcriptional errors that result from this process are unintentional.   Carol Lords MD, 9:16 AM 08/26/2013

## 2013-08-26 NOTE — Patient Instructions (Signed)
Followup in one year for annual exam, sooner as needed. 

## 2013-08-27 LAB — URINALYSIS W MICROSCOPIC + REFLEX CULTURE
Bilirubin Urine: NEGATIVE
Casts: NONE SEEN
Glucose, UA: NEGATIVE mg/dL
Ketones, ur: NEGATIVE mg/dL
Protein, ur: NEGATIVE mg/dL
Urobilinogen, UA: 1 mg/dL (ref 0.0–1.0)

## 2013-08-27 LAB — VITAMIN D 25 HYDROXY (VIT D DEFICIENCY, FRACTURES): Vit D, 25-Hydroxy: 39 ng/mL (ref 30–89)

## 2013-08-28 LAB — URINE CULTURE: Colony Count: NO GROWTH

## 2013-08-29 ENCOUNTER — Other Ambulatory Visit: Payer: Self-pay | Admitting: Gynecology

## 2013-08-29 DIAGNOSIS — R7989 Other specified abnormal findings of blood chemistry: Secondary | ICD-10-CM

## 2013-08-29 MED ORDER — LEVOTHYROXINE SODIUM 75 MCG PO TABS: 75.0000 ug | ORAL_TABLET | Freq: Every day | ORAL | Status: DC

## 2013-08-31 ENCOUNTER — Encounter: Payer: Self-pay | Admitting: Gynecology

## 2013-09-01 ENCOUNTER — Other Ambulatory Visit: Payer: Self-pay

## 2013-10-25 ENCOUNTER — Other Ambulatory Visit: Payer: 59

## 2013-10-25 DIAGNOSIS — R7989 Other specified abnormal findings of blood chemistry: Secondary | ICD-10-CM

## 2013-10-26 LAB — TSH: TSH: 2.366 u[IU]/mL (ref 0.350–4.500)

## 2014-01-12 ENCOUNTER — Other Ambulatory Visit: Payer: Self-pay

## 2014-01-12 MED ORDER — LEVOTHYROXINE SODIUM 75 MCG PO TABS: 75.0000 ug | ORAL_TABLET | Freq: Every day | ORAL | Status: DC

## 2014-04-11 ENCOUNTER — Other Ambulatory Visit: Payer: Self-pay | Admitting: Dermatology

## 2014-05-05 ENCOUNTER — Other Ambulatory Visit: Payer: Self-pay

## 2014-05-05 DIAGNOSIS — Z1231 Encounter for screening mammogram for malignant neoplasm of breast: Secondary | ICD-10-CM

## 2014-05-25 ENCOUNTER — Other Ambulatory Visit: Payer: Self-pay | Admitting: Dermatology

## 2014-06-22 ENCOUNTER — Ambulatory Visit
Admission: RE | Admit: 2014-06-22 | Discharge: 2014-06-22 | Disposition: A | Discharge status: Home / Self Care | Payer: 59 | Source: Ambulatory Visit

## 2014-06-22 DIAGNOSIS — Z1231 Encounter for screening mammogram for malignant neoplasm of breast: Secondary | ICD-10-CM

## 2014-08-16 ENCOUNTER — Other Ambulatory Visit: Payer: Self-pay

## 2014-08-16 MED ORDER — ALENDRONATE SODIUM 70 MG PO TABS: 70.0000 mg | ORAL_TABLET | ORAL | Status: DC

## 2014-08-28 ENCOUNTER — Encounter: Payer: Self-pay | Admitting: Gynecology

## 2014-08-31 ENCOUNTER — Ambulatory Visit (INDEPENDENT_AMBULATORY_CARE_PROVIDER_SITE_OTHER): Payer: 59 | Admitting: Gynecology

## 2014-08-31 ENCOUNTER — Encounter: Payer: Self-pay | Admitting: Gynecology

## 2014-08-31 VITALS — BP 120/74 | Ht 67.0 in | Wt 121.0 lb

## 2014-08-31 DIAGNOSIS — E038 Other specified hypothyroidism: Secondary | ICD-10-CM

## 2014-08-31 DIAGNOSIS — M810 Age-related osteoporosis without current pathological fracture: Secondary | ICD-10-CM

## 2014-08-31 DIAGNOSIS — N952 Postmenopausal atrophic vaginitis: Secondary | ICD-10-CM

## 2014-08-31 DIAGNOSIS — Z01419 Encounter for gynecological examination (general) (routine) without abnormal findings: Secondary | ICD-10-CM

## 2014-08-31 LAB — CBC WITH DIFFERENTIAL/PLATELET
Basophils Absolute: 0 10*3/uL (ref 0.0–0.1)
Basophils Relative: 0 % (ref 0–1)
Eosinophils Absolute: 0 10*3/uL (ref 0.0–0.7)
Eosinophils Relative: 1 % (ref 0–5)
HCT: 39.2 % (ref 36.0–46.0)
Hemoglobin: 13.3 g/dL (ref 12.0–15.0)
LYMPHS ABS: 1.2 10*3/uL (ref 0.7–4.0)
Lymphocytes Relative: 27 % (ref 12–46)
MCH: 29.3 pg (ref 26.0–34.0)
MCHC: 33.9 g/dL (ref 30.0–36.0)
MCV: 86.3 fL (ref 78.0–100.0)
MONOS PCT: 7 % (ref 3–12)
Monocytes Absolute: 0.3 10*3/uL (ref 0.1–1.0)
NEUTROS PCT: 65 % (ref 43–77)
Neutro Abs: 2.9 10*3/uL (ref 1.7–7.7)
PLATELETS: 175 10*3/uL (ref 150–400)
RBC: 4.54 MIL/uL (ref 3.87–5.11)
RDW: 13.4 % (ref 11.5–15.5)
WBC: 4.5 10*3/uL (ref 4.0–10.5)

## 2014-08-31 LAB — LIPID PANEL
Cholesterol: 186 mg/dL (ref 0–200)
HDL: 74 mg/dL (ref >39)
LDL CALC: 99 mg/dL (ref 0–99)
TRIGLYCERIDES: 63 mg/dL (ref <150)
Total CHOL/HDL Ratio: 2.5 Ratio
VLDL: 13 mg/dL (ref 0–40)

## 2014-08-31 LAB — COMPREHENSIVE METABOLIC PANEL
ALBUMIN: 4.3 g/dL (ref 3.5–5.2)
ALK PHOS: 47 U/L (ref 39–117)
ALT: 14 U/L (ref 0–35)
AST: 24 U/L (ref 0–37)
BUN: 12 mg/dL (ref 6–23)
CO2: 30 mEq/L (ref 19–32)
Calcium: 9.2 mg/dL (ref 8.4–10.5)
Chloride: 102 mEq/L (ref 96–112)
Creat: 0.7 mg/dL (ref 0.50–1.10)
GLUCOSE: 75 mg/dL (ref 70–99)
POTASSIUM: 4.2 meq/L (ref 3.5–5.3)
Sodium: 138 mEq/L (ref 135–145)
TOTAL PROTEIN: 6.9 g/dL (ref 6.0–8.3)
Total Bilirubin: 0.8 mg/dL (ref 0.2–1.2)

## 2014-08-31 MED ORDER — LEVOTHYROXINE SODIUM 75 MCG PO TABS: 75.0000 ug | ORAL_TABLET | Freq: Every day | ORAL | Status: DC

## 2014-08-31 MED ORDER — ALENDRONATE SODIUM 70 MG PO TABS: 70.0000 mg | ORAL_TABLET | ORAL | Status: DC

## 2014-08-31 NOTE — Patient Instructions (Signed)
You may obtain a copy of any labs that were done today by logging onto MyChart as outlined in the instructions provided with your AVS (after visit summary). The office will not call with normal lab results but certainly if there are any significant abnormalities then we will contact you.   Health Maintenance, Female A healthy lifestyle and preventative care can promote health and wellness.  Maintain regular health, dental, and eye exams.  Eat a healthy diet. Foods like vegetables, fruits, whole grains, low-fat dairy products, and lean protein foods contain the nutrients you need without too many calories. Decrease your intake of foods high in solid fats, added sugars, and salt. Get information about a proper diet from your caregiver, if necessary.  Regular physical exercise is one of the most important things you can do for your health. Most adults should get at least 150 minutes of moderate-intensity exercise (any activity that increases your heart rate and causes you to sweat) each week. In addition, most adults need muscle-strengthening exercises on 2 or more days a week.   Maintain a healthy weight. The body mass index (BMI) is a screening tool to identify possible weight problems. It provides an estimate of body fat based on height and weight. Your caregiver can help determine your BMI, and can help you achieve or maintain a healthy weight. For adults 20 years and older:  A BMI below 18.5 is considered underweight.  A BMI of 18.5 to 24.9 is normal.  A BMI of 25 to 29.9 is considered overweight.  A BMI of 30 and above is considered obese.  Maintain normal blood lipids and cholesterol by exercising and minimizing your intake of saturated fat. Eat a balanced diet with plenty of fruits and vegetables. Blood tests for lipids and cholesterol should begin at age 61 and be repeated every 5 years. If your lipid or cholesterol levels are high, you are over 50, or you are a high risk for heart  disease, you may need your cholesterol levels checked more frequently.Ongoing high lipid and cholesterol levels should be treated with medicines if diet and exercise are not effective.  If you smoke, find out from your caregiver how to quit. If you do not use tobacco, do not start.  Lung cancer screening is recommended for adults aged 33 80 years who are at high risk for developing lung cancer because of a history of smoking. Yearly low-dose computed tomography (CT) is recommended for people who have at least a 30-pack-year history of smoking and are a current smoker or have quit within the past 15 years. A pack year of smoking is smoking an average of 1 pack of cigarettes a day for 1 year (for example: 1 pack a day for 30 years or 2 packs a day for 15 years). Yearly screening should continue until the smoker has stopped smoking for at least 15 years. Yearly screening should also be stopped for people who develop a health problem that would prevent them from having lung cancer treatment.  If you are pregnant, do not drink alcohol. If you are breastfeeding, be very cautious about drinking alcohol. If you are not pregnant and choose to drink alcohol, do not exceed 1 drink per day. One drink is considered to be 12 ounces (355 mL) of beer, 5 ounces (148 mL) of wine, or 1.5 ounces (44 mL) of liquor.  Avoid use of street drugs. Do not share needles with anyone. Ask for help if you need support or instructions about stopping  the use of drugs.  High blood pressure causes heart disease and increases the risk of stroke. Blood pressure should be checked at least every 1 to 2 years. Ongoing high blood pressure should be treated with medicines, if weight loss and exercise are not effective.  If you are 59 to 57 years old, ask your caregiver if you should take aspirin to prevent strokes.  Diabetes screening involves taking a blood sample to check your fasting blood sugar level. This should be done once every 3  years, after age 91, if you are within normal weight and without risk factors for diabetes. Testing should be considered at a younger age or be carried out more frequently if you are overweight and have at least 1 risk factor for diabetes.  Breast cancer screening is essential preventative care for women. You should practice "breast self-awareness." This means understanding the normal appearance and feel of your breasts and may include breast self-examination. Any changes detected, no matter how small, should be reported to a caregiver. Women in their 66s and 30s should have a clinical breast exam (CBE) by a caregiver as part of a regular health exam every 1 to 3 years. After age 101, women should have a CBE every year. Starting at age 100, women should consider having a mammogram (breast X-ray) every year. Women who have a family history of breast cancer should talk to their caregiver about genetic screening. Women at a high risk of breast cancer should talk to their caregiver about having an MRI and a mammogram every year.  Breast cancer gene (BRCA)-related cancer risk assessment is recommended for women who have family members with BRCA-related cancers. BRCA-related cancers include breast, ovarian, tubal, and peritoneal cancers. Having family members with these cancers may be associated with an increased risk for harmful changes (mutations) in the breast cancer genes BRCA1 and BRCA2. Results of the assessment will determine the need for genetic counseling and BRCA1 and BRCA2 testing.  The Pap test is a screening test for cervical cancer. Women should have a Pap test starting at age 57. Between ages 25 and 35, Pap tests should be repeated every 2 years. Beginning at age 37, you should have a Pap test every 3 years as long as the past 3 Pap tests have been normal. If you had a hysterectomy for a problem that was not cancer or a condition that could lead to cancer, then you no longer need Pap tests. If you are  between ages 50 and 76, and you have had normal Pap tests going back 10 years, you no longer need Pap tests. If you have had past treatment for cervical cancer or a condition that could lead to cancer, you need Pap tests and screening for cancer for at least 20 years after your treatment. If Pap tests have been discontinued, risk factors (such as a new sexual partner) need to be reassessed to determine if screening should be resumed. Some women have medical problems that increase the chance of getting cervical cancer. In these cases, your caregiver may recommend more frequent screening and Pap tests.  The human papillomavirus (HPV) test is an additional test that may be used for cervical cancer screening. The HPV test looks for the virus that can cause the cell changes on the cervix. The cells collected during the Pap test can be tested for HPV. The HPV test could be used to screen women aged 44 years and older, and should be used in women of any age  who have unclear Pap test results. After the age of 55, women should have HPV testing at the same frequency as a Pap test.  Colorectal cancer can be detected and often prevented. Most routine colorectal cancer screening begins at the age of 44 and continues through age 20. However, your caregiver may recommend screening at an earlier age if you have risk factors for colon cancer. On a yearly basis, your caregiver may provide home test kits to check for hidden blood in the stool. Use of a small camera at the end of a tube, to directly examine the colon (sigmoidoscopy or colonoscopy), can detect the earliest forms of colorectal cancer. Talk to your caregiver about this at age 86, when routine screening begins. Direct examination of the colon should be repeated every 5 to 10 years through age 13, unless early forms of pre-cancerous polyps or small growths are found.  Hepatitis C blood testing is recommended for all people born from [birth year] through 1964 and any  individual with known risks for hepatitis C.  Practice safe sex. Use condoms and avoid high-risk sexual practices to reduce the spread of sexually transmitted infections (STIs). Sexually active women aged 36 and younger should be checked for Chlamydia, which is a common sexually transmitted infection. Older women with new or multiple partners should also be tested for Chlamydia. Testing for other STIs is recommended if you are sexually active and at increased risk.  Osteoporosis is a disease in which the bones lose minerals and strength with aging. This can result in serious bone fractures. The risk of osteoporosis can be identified using a bone density scan. Women ages 20 and over and women at risk for fractures or osteoporosis should discuss screening with their caregivers. Ask your caregiver whether you should be taking a calcium supplement or vitamin D to reduce the rate of osteoporosis.  Menopause can be associated with physical symptoms and risks. Hormone replacement therapy is available to decrease symptoms and risks. You should talk to your caregiver about whether hormone replacement therapy is right for you.  Use sunscreen. Apply sunscreen liberally and repeatedly throughout the day. You should seek shade when your shadow is shorter than you. Protect yourself by wearing long sleeves, pants, a wide-brimmed hat, and sunglasses year round, whenever you are outdoors.  Notify your caregiver of new moles or changes in moles, especially if there is a change in shape or color. Also notify your caregiver if a mole is larger than the size of a pencil eraser.  Stay current with your immunizations. Document Released: 04/28/2011 Document Revised: 02/07/2013 Document Reviewed: 04/28/2011 [facility] Patient Information 2014 [employer].

## 2014-08-31 NOTE — Progress Notes (Signed)
Carol KarvonenGail I Mata 03/11/1956 [id number]        58 y.o.  [id number] for annual exam.  Several issues noted below.  Past medical history,surgical history, problem list, medications, allergies, family history and social history were all reviewed and documented as reviewed in the EPIC chart.  ROS:  12 system ROS performed with pertinent positives and negatives included in the history, assessment and plan.   Additional significant findings :  none   Exam: [facility] personass[facility] Filed Vitals:   08/31/14 0828  BP: 120/74  Height: 5\' 7"  (1.702 m)  Weight: 121 lb (54.885 kg)   General appearance:  Normal affect, orientation and appearance. Skin: Grossly normal HEENT: Without gross lesions.  No cervical or supraclavicular adenopathy. Thyroid normal.  Lungs:  Clear without wheezing, rales or rhonchi Cardiac: RR, without RMG Abdominal:  Soft, nontender, without masses, guarding, rebound, organomegaly or hernia Breasts:  Examined lying and sitting without masses, retractions, discharge or axillary adenopathy. Pelvic:  Ext/BUS/vagina normal with mild atrophic changes  Cervix normal with mild atrophic changes  Uterus anteverted, normal size, shape and contour, midline and mobile nontender   Adnexa  Without masses or tenderness    Anus and perineum  Normal   Rectovaginal  Normal sphincter tone without palpated masses or tenderness.    Assessment/Plan:  58 y.o. [id number] female for annual exam.   1. Postmenopausal/atrophic genital changes. Patient without significant symptoms of hot flashes, night sweats, vaginal dryness or dyspareunia. No vaginal bleeding. Continue to monitor. Report any vaginal bleeding. 2. Osteoporosis.  DEXA 12/2012 T score -2.5. Started on alendronate and doing well. Repeat DEXA next year at two-year interval. Vitamin D/calcium recommendations reviewed. Check vitamin D level today. 3. Hypothyroid. Check TSH today. Refill Synthroid 75 g 1 year. 4. Pap smear 2013. No Pap smear done today.  No history of significant abnormal Pap smears. Plan repeat Pap smear next year at three-year interval. 5. Mammography 05/2014. Continue with annual mammography. SBE monthly reviewed. 6. Colonoscopy 2015. Repeat at their recommended interval. 7. Health maintenance. Baseline CBC, comprehensive metabolic panel lipid profile urinalysis TSH vitamin D ordered. Follow up in one year, sooner as needed.  Had been on Ambien previously but stated that she stopped it and no longer needs it.     Dara LordsFONTAINE,Lawyer Washabaugh P MD, 9:06 AM 08/31/2014

## 2014-09-01 ENCOUNTER — Encounter: Payer: Self-pay | Admitting: Gynecology

## 2014-09-01 LAB — URINALYSIS W MICROSCOPIC + REFLEX CULTURE
Bacteria, UA: NONE SEEN
Bilirubin Urine: NEGATIVE
CRYSTALS: NONE SEEN
Casts: NONE SEEN
Glucose, UA: NEGATIVE mg/dL
Ketones, ur: NEGATIVE mg/dL
NITRITE: NEGATIVE
Protein, ur: NEGATIVE mg/dL
SPECIFIC GRAVITY, URINE: 1.016 (ref 1.005–1.030)
SQUAMOUS EPITHELIAL / LPF: NONE SEEN
UROBILINOGEN UA: 0.2 mg/dL (ref 0.0–1.0)
pH: 6 (ref 5.0–8.0)

## 2014-09-01 LAB — VITAMIN D 25 HYDROXY (VIT D DEFICIENCY, FRACTURES): Vit D, 25-Hydroxy: 41 ng/mL (ref 30–89)

## 2014-09-01 LAB — TSH: TSH: 3.409 u[IU]/mL (ref 0.350–4.500)

## 2014-09-02 LAB — URINE CULTURE
COLONY COUNT: NO GROWTH
ORGANISM ID, BACTERIA: NO GROWTH

## 2014-12-14 ENCOUNTER — Other Ambulatory Visit: Payer: Self-pay | Admitting: Gynecology

## 2014-12-14 ENCOUNTER — Encounter: Payer: Self-pay | Admitting: Gynecology

## 2014-12-14 DIAGNOSIS — M810 Age-related osteoporosis without current pathological fracture: Secondary | ICD-10-CM

## 2014-12-26 DIAGNOSIS — M810 Age-related osteoporosis without current pathological fracture: Secondary | ICD-10-CM

## 2014-12-26 HISTORY — DX: Age-related osteoporosis without current pathological fracture: M81.0

## 2015-01-23 ENCOUNTER — Telehealth: Payer: Self-pay | Admitting: Gynecology

## 2015-01-23 ENCOUNTER — Encounter: Payer: Self-pay | Admitting: Gynecology

## 2015-01-23 ENCOUNTER — Ambulatory Visit (INDEPENDENT_AMBULATORY_CARE_PROVIDER_SITE_OTHER): Payer: Managed Care, Other (non HMO)

## 2015-01-23 DIAGNOSIS — M810 Age-related osteoporosis without current pathological fracture: Secondary | ICD-10-CM | POA: Diagnosis not present

## 2015-01-23 NOTE — Telephone Encounter (Signed)
Pt informed with the below note. 

## 2015-01-23 NOTE — Telephone Encounter (Signed)
Tell patient bone density looks improved at spine and left hip. Right hip is stable. Continue on Fosamax for now and plan on repeating bone density in 2 years. At that point we will discuss drug-free holiday and stopping the medication assuming she continues to have a good response.

## 2015-05-21 ENCOUNTER — Other Ambulatory Visit: Payer: Self-pay

## 2015-05-21 DIAGNOSIS — Z1231 Encounter for screening mammogram for malignant neoplasm of breast: Secondary | ICD-10-CM

## 2015-06-27 ENCOUNTER — Ambulatory Visit
Admission: RE | Admit: 2015-06-27 | Discharge: 2015-06-27 | Disposition: A | Discharge status: Home / Self Care | Payer: Managed Care, Other (non HMO) | Source: Ambulatory Visit

## 2015-06-27 ENCOUNTER — Telehealth: Payer: Self-pay | Admitting: *Deleted

## 2015-06-27 DIAGNOSIS — Z1231 Encounter for screening mammogram for malignant neoplasm of breast: Secondary | ICD-10-CM

## 2015-06-27 MED ORDER — ALPRAZOLAM 0.5 MG PO TABS: 0.5000 mg | ORAL_TABLET | Freq: Every evening | ORAL | Status: DC | PRN

## 2015-06-27 MED ORDER — TRAZODONE HCL 50 MG PO TABS: 50.0000 mg | ORAL_TABLET | Freq: Every day | ORAL | Status: DC

## 2015-06-27 NOTE — Telephone Encounter (Signed)
Pt called for 2 things:  1. Has trouble falling asleep took Ambien in past didn't like the side effects. Pt said her friends have tried trazodone and done well, OTC sleep aids no relief. Would you be willing to prescribe?  2. Leaving for a trip in [place] on 07/05/15, very nervous flyer, asked if you could prescribe Xanax to help her for this?     Or should pt schedule OV to discuss? Please advise

## 2015-06-27 NOTE — Telephone Encounter (Signed)
#  1 trazodone 50 mg #30 refill 1 okay for sleeping. Is what they call offbrand labeling meaning that is not designed for this but I have used it patients in the past with good results. #2 Xanax 0.5 mg #30 okay to help with flying, no refill

## 2015-06-27 NOTE — Telephone Encounter (Signed)
Pt aware with the below note, Xanax 0.5 mg called in to pharmacy.

## 2015-09-04 ENCOUNTER — Other Ambulatory Visit: Payer: Self-pay | Admitting: Gynecology

## 2015-09-13 ENCOUNTER — Ambulatory Visit (INDEPENDENT_AMBULATORY_CARE_PROVIDER_SITE_OTHER): Payer: Managed Care, Other (non HMO) | Admitting: Gynecology

## 2015-09-13 ENCOUNTER — Other Ambulatory Visit (HOSPITAL_COMMUNITY)
Admission: RE | Admit: 2015-09-13 | Discharge: 2015-09-13 | Disposition: A | Discharge status: Home / Self Care | Payer: Managed Care, Other (non HMO) | Source: Ambulatory Visit | Attending: Gynecology | Admitting: Gynecology

## 2015-09-13 ENCOUNTER — Encounter: Payer: Self-pay | Admitting: Gynecology

## 2015-09-13 VITALS — BP 120/66 | Ht 67.5 in | Wt 117.0 lb

## 2015-09-13 DIAGNOSIS — Z01419 Encounter for gynecological examination (general) (routine) without abnormal findings: Secondary | ICD-10-CM | POA: Insufficient documentation

## 2015-09-13 DIAGNOSIS — E038 Other specified hypothyroidism: Secondary | ICD-10-CM | POA: Diagnosis not present

## 2015-09-13 DIAGNOSIS — Z13220 Encounter for screening for lipoid disorders: Secondary | ICD-10-CM | POA: Diagnosis not present

## 2015-09-13 DIAGNOSIS — M810 Age-related osteoporosis without current pathological fracture: Secondary | ICD-10-CM | POA: Diagnosis not present

## 2015-09-13 DIAGNOSIS — N952 Postmenopausal atrophic vaginitis: Secondary | ICD-10-CM

## 2015-09-13 LAB — CBC WITH DIFFERENTIAL/PLATELET
BASOS ABS: 0 10*3/uL (ref 0.0–0.1)
Basophils Relative: 0 % (ref 0–1)
EOS ABS: 0 10*3/uL (ref 0.0–0.7)
Eosinophils Relative: 1 % (ref 0–5)
HCT: 37.1 % (ref 36.0–46.0)
Hemoglobin: 13.1 g/dL (ref 12.0–15.0)
Lymphocytes Relative: 26 % (ref 12–46)
Lymphs Abs: 1 10*3/uL (ref 0.7–4.0)
MCH: 30.5 pg (ref 26.0–34.0)
MCHC: 35.3 g/dL (ref 30.0–36.0)
MCV: 86.5 fL (ref 78.0–100.0)
MONO ABS: 0.3 10*3/uL (ref 0.1–1.0)
MPV: 9.4 fL (ref 8.6–12.4)
Monocytes Relative: 9 % (ref 3–12)
Neutro Abs: 2.4 10*3/uL (ref 1.7–7.7)
Neutrophils Relative %: 64 % (ref 43–77)
Platelets: 168 10*3/uL (ref 150–400)
RBC: 4.29 MIL/uL (ref 3.87–5.11)
RDW: 14 % (ref 11.5–15.5)
WBC: 3.8 10*3/uL — ABNORMAL LOW (ref 4.0–10.5)

## 2015-09-13 LAB — LIPID PANEL
CHOL/HDL RATIO: 2.4 ratio (ref <=5.0)
Cholesterol: 187 mg/dL (ref 125–200)
HDL: 77 mg/dL (ref >=46)
LDL CALC: 98 mg/dL (ref <130)
Triglycerides: 58 mg/dL (ref <150)
VLDL: 12 mg/dL (ref <30)

## 2015-09-13 LAB — COMPREHENSIVE METABOLIC PANEL
ALK PHOS: 47 U/L (ref 33–130)
ALT: 18 U/L (ref 6–29)
AST: 27 U/L (ref 10–35)
Albumin: 4.2 g/dL (ref 3.6–5.1)
BUN: 12 mg/dL (ref 7–25)
CHLORIDE: 103 mmol/L (ref 98–110)
CO2: 29 mmol/L (ref 20–31)
Calcium: 9.1 mg/dL (ref 8.6–10.4)
Creat: 0.67 mg/dL (ref 0.50–1.05)
Glucose, Bld: 76 mg/dL (ref 65–99)
POTASSIUM: 4.4 mmol/L (ref 3.5–5.3)
Sodium: 139 mmol/L (ref 135–146)
TOTAL PROTEIN: 6.8 g/dL (ref 6.1–8.1)
Total Bilirubin: 0.7 mg/dL (ref 0.2–1.2)

## 2015-09-13 LAB — TSH: TSH: 3.153 u[IU]/mL (ref 0.350–4.500)

## 2015-09-13 MED ORDER — LEVOTHYROXINE SODIUM 75 MCG PO TABS: ORAL_TABLET | ORAL | Status: DC

## 2015-09-13 MED ORDER — ALENDRONATE SODIUM 70 MG PO TABS: ORAL_TABLET | ORAL | Status: DC

## 2015-09-13 NOTE — Progress Notes (Signed)
Carol KarvonenGail I Mata Jan 31, 1956 [id number]        59 y.o.  [id number]  for annual exam.  Doing well.  Past medical history,surgical history, problem list, medications, allergies, family history and social history were all reviewed and documented as reviewed in the EPIC chart.  ROS:  Performed with pertinent positives and negatives included in the history, assessment and plan.   Additional significant findings :  none   Exam: [facility] personass[facility] Filed Vitals:   09/13/15 0937  BP: 120/66  Height: 5' 7.5" (1.715 m)  Weight: 117 lb (53.071 kg)   General appearance:  Normal affect, orientation and appearance. Skin: Grossly normal HEENT: Without gross lesions.  No cervical or supraclavicular adenopathy. Thyroid normal.  Lungs:  Clear without wheezing, rales or rhonchi Cardiac: RR, without RMG Abdominal:  Soft, nontender, without masses, guarding, rebound, organomegaly or hernia Breasts:  Examined lying and sitting without masses, retractions, discharge or axillary adenopathy. Pelvic:  Ext/BUS/vagina with atrophic changes  Cervix with atrophic changes  Uterus anteverted, normal size, shape and contour, midline and mobile nontender   Adnexa  Without masses or tenderness    Anus and perineum  Normal   Rectovaginal  Normal sphincter tone without palpated masses or tenderness.    Assessment/Plan:  59 y.o. [id number] female for annual exam.   1. Postmenopausal/atrophic genital changes. Patient doing well without significant flushes, night sweats, vaginal dryness or any vaginal bleeding. Continue to monitor and report any vaginal bleeding. 2. Osteoporosis. DEXA 12/2014 T score -2.2. 2014 T score -2.5. On alendronate 70 mg weekly 2 years. Will continue on alendronate for now with planned repeat DEXA in 2 years. Refill alendronate 70 mg weekly 1 year. Check vitamin D level today. 3. Pap smear 2013. Pap smear today. No history of abnormal Pap smears previously. 4. Mammography 05/2015. Continue with annual  mammography when due. SBE monthly reviewed. 5. Colonoscopy 2015. Repeat at their recommended interval. 6. Hypothyroid. Refill Synthroid 75 g 1 year. Check TSH today. 7. History of insomnia. Had used Ambien in the past. Reviewed sleep health with her to include exercising on a regular basis, avoiding caffeine, etc. Options for sleep aids reviewed. She is going to try Benadryl 25 mg at bedtime. She may call if she wants to retry Ambien. 8. Health maintenance. Baseline CBC, comprehensive metabolic panel, lipid profile, urinalysis, TSH, vitamin D ordered. Follow up in one year, sooner as needed.   Dara LordsFONTAINE,Keirstyn Aydt P MD, 10:07 AM 09/13/2015

## 2015-09-13 NOTE — Addendum Note (Signed)
Addended by: Dayna BarkerGARDNER, KIMBERLY K on: 09/13/2015 10:24 AM   Modules accepted: Orders

## 2015-09-13 NOTE — Patient Instructions (Signed)

## 2015-09-14 LAB — URINALYSIS W MICROSCOPIC + REFLEX CULTURE
BACTERIA UA: NONE SEEN [HPF]
BILIRUBIN URINE: NEGATIVE
CASTS: NONE SEEN [LPF]
CRYSTALS: NONE SEEN [HPF]
Glucose, UA: NEGATIVE
HGB URINE DIPSTICK: NEGATIVE
KETONES UR: NEGATIVE
Leukocytes, UA: NEGATIVE
Nitrite: NEGATIVE
Protein, ur: NEGATIVE
SQUAMOUS EPITHELIAL / LPF: NONE SEEN [HPF] (ref <=5)
Specific Gravity, Urine: 1.018 (ref 1.001–1.035)
Yeast: NONE SEEN [HPF]
pH: 7.5 (ref 5.0–8.0)

## 2015-09-14 LAB — VITAMIN D 25 HYDROXY (VIT D DEFICIENCY, FRACTURES): VIT D 25 HYDROXY: 27 ng/mL — AB (ref 30–100)

## 2015-09-15 LAB — URINE CULTURE
COLONY COUNT: NO GROWTH
ORGANISM ID, BACTERIA: NO GROWTH

## 2015-09-17 ENCOUNTER — Other Ambulatory Visit: Payer: Self-pay | Admitting: Gynecology

## 2015-09-17 DIAGNOSIS — E559 Vitamin D deficiency, unspecified: Secondary | ICD-10-CM

## 2015-09-18 LAB — CYTOLOGY - PAP

## 2016-01-29 ENCOUNTER — Other Ambulatory Visit: Payer: Managed Care, Other (non HMO)

## 2016-01-29 DIAGNOSIS — E559 Vitamin D deficiency, unspecified: Secondary | ICD-10-CM

## 2016-01-30 ENCOUNTER — Other Ambulatory Visit: Payer: Self-pay | Admitting: Gynecology

## 2016-01-30 DIAGNOSIS — E559 Vitamin D deficiency, unspecified: Secondary | ICD-10-CM

## 2016-01-30 LAB — VITAMIN D 25 HYDROXY (VIT D DEFICIENCY, FRACTURES): VIT D 25 HYDROXY: 30 ng/mL (ref 30–100)

## 2016-05-23 ENCOUNTER — Other Ambulatory Visit: Payer: Self-pay | Admitting: Gynecology

## 2016-05-23 DIAGNOSIS — Z1231 Encounter for screening mammogram for malignant neoplasm of breast: Secondary | ICD-10-CM

## 2016-07-03 ENCOUNTER — Ambulatory Visit: Payer: Managed Care, Other (non HMO)

## 2016-07-14 ENCOUNTER — Telehealth: Payer: Self-pay | Admitting: *Deleted

## 2016-07-14 MED ORDER — ZOLPIDEM TARTRATE 10 MG PO TABS
10.0000 mg | ORAL_TABLET | Freq: Every evening | ORAL | 2 refills | Status: DC | PRN
Start: 2016-07-14 — End: 2016-07-14

## 2016-07-14 MED ORDER — ZOLPIDEM TARTRATE 5 MG PO TABS: 5.0000 mg | ORAL_TABLET | Freq: Every evening | ORAL | 2 refills | Status: DC | PRN

## 2016-07-14 NOTE — Telephone Encounter (Signed)
Okay to switch?  

## 2016-07-14 NOTE — Telephone Encounter (Signed)
Rx called in, pt aware 

## 2016-07-14 NOTE — Telephone Encounter (Signed)
Pt informed would like to take 5 mg tablet of Ambien it appears this is the dose she was taking in past.  would like Rx for this. Okay to switch.

## 2016-07-14 NOTE — Telephone Encounter (Signed)
Pt called requesting refill on Ambien take for insomnia as needed. Please advise

## 2016-07-14 NOTE — Telephone Encounter (Signed)
Ambien 10 mg #30 with 2 refills okay.

## 2016-07-22 ENCOUNTER — Telehealth: Payer: Self-pay | Admitting: Allergy and Immunology

## 2016-07-22 NOTE — Telephone Encounter (Signed)
New patient has an appt on 10/24 with Dr. Lucie LeatherKozlow. She said her insurance covers her office visit with a co pay, but wants to know if there will be a separate procedure, other than the normal visit. If so, what is a code for that? She needs to call her insurance and make sure they cover that extra procedure if there is one.

## 2016-07-22 NOTE — Telephone Encounter (Signed)
Lm for pt to call us back only codes we have is for testing

## 2016-07-22 NOTE — Telephone Encounter (Signed)
Spoke with pt and she had more questions then I could answer. [facility]

## 2016-07-24 ENCOUNTER — Ambulatory Visit
Admission: RE | Admit: 2016-07-24 | Discharge: 2016-07-24 | Disposition: A | Discharge status: Home / Self Care | Payer: Managed Care, Other (non HMO) | Source: Ambulatory Visit | Attending: Gynecology | Admitting: Gynecology

## 2016-07-24 DIAGNOSIS — Z1231 Encounter for screening mammogram for malignant neoplasm of breast: Secondary | ICD-10-CM

## 2016-08-12 ENCOUNTER — Ambulatory Visit: Payer: Managed Care, Other (non HMO) | Admitting: Allergy and Immunology

## 2016-08-19 ENCOUNTER — Encounter: Payer: Self-pay | Admitting: Allergy and Immunology

## 2016-08-19 ENCOUNTER — Ambulatory Visit (INDEPENDENT_AMBULATORY_CARE_PROVIDER_SITE_OTHER): Payer: Managed Care, Other (non HMO) | Admitting: Allergy and Immunology

## 2016-08-19 VITALS — BP 118/70 | HR 72 | Resp 16

## 2016-08-19 DIAGNOSIS — Z91018 Allergy to other foods: Secondary | ICD-10-CM | POA: Diagnosis not present

## 2016-08-19 DIAGNOSIS — K529 Noninfective gastroenteritis and colitis, unspecified: Secondary | ICD-10-CM | POA: Diagnosis not present

## 2016-08-19 DIAGNOSIS — J3089 Other allergic rhinitis: Secondary | ICD-10-CM

## 2016-08-19 MED ORDER — COLESTIPOL HCL 1 G PO TABS: 1.0000 g | ORAL_TABLET | Freq: Two times a day (BID) | ORAL | 3 refills | Status: DC

## 2016-08-19 NOTE — Patient Instructions (Addendum)
  1. Allergen avoidance measures?  2. Blood - celiac screen with IgA, egg IgE with reflux, alpha gal panel  3. Colestipol 1 g tablet - 1-2 tablets one time per day  4. Further evaluation and treatment?  5. Can continue flonase and antihistamine if needed.

## 2016-08-19 NOTE — Progress Notes (Signed)
Dear Dr. Audie BoxFontaine,  Thank you for referring [facility] KarvonenGail I Bump [facility]  on [facility].   Below is a summation of this patient's evaluation and recommendations.  Thank you for your referral. I will keep you informed about this patient's response to treatment.   If you have any questions please to do hesitate to contact me.   Sincerely,  Jessica PriestEric J. Kayliegh Boyers, MD [facility]    ______________________________________________________________________    NEW PATIENT NOTE  Referring Provider: Dara LordsFontaine, Timothy P, MD Primary Provider: Dara LordsFONTAINE,TIMOTHY P, MD Date of office visit: 08/19/2016    Subjective:   Chief Complaint:  Carol Mata (DOB: 06-22-1956) is a 60 y.o. female who presents to the clinic on 08/19/2016 with a chief complaint of New Patient (Initial Visit) and Allergy Testing .     HPI: Carol Mata presents to this clinic in evaluation of possible food allergy. Apparently 25 years ago Carol Mata had developed issues with postprandial diarrhea and cramping and was told that she was lactase insufficient and she's been relatively lactose free since that point in time yet still continues to have this postprandial diarrhea and cramping. This occurs within 1 hour eating and it appears to occur with the act of eating rather than eating any specific food although there does appear to be more of a reaction when eating egg. There is no other associated systemic or constitutional symptoms. She did have a history of allergic rhinitis during childhood for which she underwent immunotherapy with resolution of most of this atopic issue.   Past Medical History:  Diagnosis Date  . ASCUS (atypical squamous cells of undetermined significance) on Pap smear   . Lactose intolerance   . Osteoporosis 12/2014   2014 T score -2.5. 2016 T score -2.2 statistically significant improvement AP spine and left hip, right hip  stable  . Sinusitis   . Thyroid disease    Hypothyroid  . Vertigo     Past Surgical History:  Procedure Laterality Date  . BUNIONECTOMY    . DILATION AND CURETTAGE OF UTERUS    . HYSTEROSCOPY  [id number]  . NASAL SINUS SURGERY    . PELVIC LAPAROSCOPY     DL-Rt salpingectomy      Medication List      alendronate 70 MG tablet Commonly known as:  FOSAMAX TAKE 1 TABLET BY MOUTH EVERY 7 DAYS. TAKE WITH A FULL GLASS OF WATER ON AN EMPTY STOMACH.   fluticasone 50 MCG/ACT nasal spray Commonly known as:  FLONASE Place 2 sprays into the nose as needed.   levothyroxine 75 MCG tablet Commonly known as:  SYNTHROID TAKE ONE TABLET EACH DAY BEFORE BREAKFAST   zolpidem 5 MG tablet Commonly known as:  AMBIEN Take 1 tablet (5 mg total) by mouth at bedtime as needed for sleep.       Allergies  Allergen Reactions  . Lactose Intolerance (Gi)     Review of systems negative except as noted in HPI / PMHx or noted below:  Review of Systems  Constitutional: Negative.   HENT: Negative.   Eyes: Negative.   Respiratory: Negative.   Cardiovascular: Negative.   Gastrointestinal: Negative.   Genitourinary: Negative.   Musculoskeletal: Negative.   Skin: Negative.   Neurological: Negative.   Endo/Heme/Allergies: Negative.   Psychiatric/Behavioral: Negative.     Family History  Problem Relation Age of Onset  . Hypertension Father   . Heart disease Father   .  Cancer Father     Prostate  . Cancer Maternal Aunt     Colon cancer    Social History   Social History  . Marital status: Married    Spouse name: N/A  . Number of children: N/A  . Years of education: N/A   Occupational History  . Not on file.   Social History Main Topics  . Smoking status: Former Games developer  . Smokeless tobacco: Never Used  . Alcohol use 8.4 oz/week    14 Standard drinks or equivalent per week  . Drug use: No  . Sexual activity: Yes    Birth control/ protection: Post-menopausal     Comment:  Vasectomy-1st intercourse 60 yo-Fewer than 5 partners   Other Topics Concern  . Not on file   Social History Narrative  . No narrative on file    Environmental and Social history  Lives in a house with a dry environment, no animals located inside the household, carpeting in the bedroom, no plastic on the bed or pillow, and no smokers located inside the household. 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  HENT:  Head: Normocephalic. Head is without right periorbital erythema and without left periorbital erythema.  Right Ear: Tympanic membrane, external ear and ear canal normal.  Left Ear: Tympanic membrane, external ear and ear canal normal.  Nose: Nose normal. No mucosal edema or rhinorrhea.  Mouth/Throat: Oropharynx is clear and moist and mucous membranes are normal. No oropharyngeal exudate.  Eyes: Conjunctivae and lids are normal. Pupils are equal, round, and reactive to light.  Neck: Trachea normal. No tracheal deviation present. No thyromegaly present.  Cardiovascular: Normal rate, regular rhythm, S1 normal, S2 normal and normal heart sounds.   No murmur heard. Pulmonary/Chest: Effort normal. No stridor. No tachypnea. No respiratory distress. She has no wheezes. She has no rales. She exhibits no tenderness.  Abdominal: Soft. She exhibits no distension and no mass. There is no hepatosplenomegaly. There is no tenderness. There is no rebound and no guarding.  Musculoskeletal: She exhibits no edema or tenderness.  Lymphadenopathy:       Head (right side): No tonsillar adenopathy present.       Head (left side): No tonsillar adenopathy present.    She has no cervical adenopathy.    She has no axillary adenopathy.  Neurological: She is alert. Gait normal.  Skin: No rash noted. She is not  diaphoretic. No erythema. No pallor. Nails show no clubbing.  Psychiatric: Mood and affect normal.    Diagnostics: Allergy skin tests were performed. She did not demonstrate any hypersensitivity against a screening panel of foods.   Assessment and Plan:    1. Food allergy   2. Postprandial diarrhea   3. Other allergic rhinitis     1. Allergen avoidance measures?  2. Blood - celiac screen with IgA, egg IgE with reflux, alpha gal panel  3. Colestipol 1 g tablet - 1-2 tablets one time per day  4. Further evaluation and treatment?  5. Can continue flonase and antihistamine if needed.  I'm going to complete Saralynn's analysis for possible food allergy by having her obtain blood tests looking for possible hypersensitivity state directed against egg and alpha gal and we'll also investigate the possibility of celiac disease with  a celiac screen. As well, I've given her prescription for colestipol that she can try to see if part of her problem is secondary to the bile acid induced irritation of her intestines. She should know within a day or 2 whether or not colestipol makes a difference regarding her postprandial diarrhea. She will contact me noting her response over the course of the next week and I will contact her with the results of her blood tests once they are available for review.   Jessica Priest, MD [facility]

## 2016-08-22 LAB — CELIAC DISEASE AB SCREEN W/RFX
Antigliadin Abs, IgA: 6 units (ref 0–19)
IgA/Immunoglobulin A, Serum: 151 mg/dL (ref 87–352)
Transglutaminase IgA: <2 U/mL (ref 0–3)

## 2016-08-22 LAB — IGE EGG WHITE W/COMPONENT RFLX: Egg White IgE: <0.1 kU/L

## 2016-08-22 LAB — ALPHA-GAL PANEL
Alpha Gal IgE*: <0.1 kU/L (ref <0.35)
BEEF CLASS INTERPRETATION: 0
Beef (Bos spp) IgE: <0.1 kU/L (ref <0.35)
Class Interpretation: 0
PORK CLASS INTERPRETATION: 0

## 2016-09-20 ENCOUNTER — Other Ambulatory Visit: Payer: Self-pay | Admitting: Gynecology

## 2016-09-26 ENCOUNTER — Encounter: Payer: Self-pay | Admitting: Internal Medicine

## 2016-09-26 ENCOUNTER — Ambulatory Visit (INDEPENDENT_AMBULATORY_CARE_PROVIDER_SITE_OTHER): Payer: Managed Care, Other (non HMO) | Admitting: Internal Medicine

## 2016-09-26 ENCOUNTER — Other Ambulatory Visit (INDEPENDENT_AMBULATORY_CARE_PROVIDER_SITE_OTHER): Payer: Managed Care, Other (non HMO)

## 2016-09-26 VITALS — BP 102/70 | HR 72 | Ht 67.0 in | Wt 117.0 lb

## 2016-09-26 DIAGNOSIS — Z23 Encounter for immunization: Secondary | ICD-10-CM

## 2016-09-26 DIAGNOSIS — M818 Other osteoporosis without current pathological fracture: Secondary | ICD-10-CM

## 2016-09-26 DIAGNOSIS — E739 Lactose intolerance, unspecified: Secondary | ICD-10-CM | POA: Insufficient documentation

## 2016-09-26 DIAGNOSIS — Z0000 Encounter for general adult medical examination without abnormal findings: Secondary | ICD-10-CM

## 2016-09-26 DIAGNOSIS — E559 Vitamin D deficiency, unspecified: Secondary | ICD-10-CM

## 2016-09-26 DIAGNOSIS — M810 Age-related osteoporosis without current pathological fracture: Secondary | ICD-10-CM | POA: Insufficient documentation

## 2016-09-26 DIAGNOSIS — K580 Irritable bowel syndrome with diarrhea: Secondary | ICD-10-CM

## 2016-09-26 DIAGNOSIS — K589 Irritable bowel syndrome without diarrhea: Secondary | ICD-10-CM | POA: Insufficient documentation

## 2016-09-26 LAB — BASIC METABOLIC PANEL
BUN: 16 mg/dL (ref 6–23)
CHLORIDE: 103 meq/L (ref 96–112)
CO2: 31 meq/L (ref 19–32)
CREATININE: 0.74 mg/dL (ref 0.40–1.20)
Calcium: 9.3 mg/dL (ref 8.4–10.5)
GFR: 85.17 mL/min (ref >60.00)
GLUCOSE: 79 mg/dL (ref 70–99)
POTASSIUM: 4.7 meq/L (ref 3.5–5.1)
Sodium: 140 mEq/L (ref 135–145)

## 2016-09-26 LAB — URINALYSIS, ROUTINE W REFLEX MICROSCOPIC
Bilirubin Urine: NEGATIVE
Ketones, ur: NEGATIVE
Nitrite: NEGATIVE
SPECIFIC GRAVITY, URINE: 1.015 (ref 1.000–1.030)
TOTAL PROTEIN, URINE-UPE24: NEGATIVE
URINE GLUCOSE: NEGATIVE
Urobilinogen, UA: 0.2 (ref 0.0–1.0)
pH: 6.5 (ref 5.0–8.0)

## 2016-09-26 LAB — CBC WITH DIFFERENTIAL/PLATELET
BASOS ABS: 0 10*3/uL (ref 0.0–0.1)
Basophils Relative: 0.3 % (ref 0.0–3.0)
EOS ABS: 0 10*3/uL (ref 0.0–0.7)
EOS PCT: 0.8 % (ref 0.0–5.0)
HCT: 37.8 % (ref 36.0–46.0)
HEMOGLOBIN: 13 g/dL (ref 12.0–15.0)
Lymphocytes Relative: 28.2 % (ref 12.0–46.0)
Lymphs Abs: 1.4 10*3/uL (ref 0.7–4.0)
MCHC: 34.3 g/dL (ref 30.0–36.0)
MCV: 87 fl (ref 78.0–100.0)
MONO ABS: 0.5 10*3/uL (ref 0.1–1.0)
Monocytes Relative: 9.1 % (ref 3.0–12.0)
Neutro Abs: 3.1 10*3/uL (ref 1.4–7.7)
Neutrophils Relative %: 61.6 % (ref 43.0–77.0)
Platelets: 175 10*3/uL (ref 150.0–400.0)
RBC: 4.35 Mil/uL (ref 3.87–5.11)
RDW: 12.4 % (ref 11.5–15.5)
WBC: 5.1 10*3/uL (ref 4.0–10.5)

## 2016-09-26 LAB — HEPATIC FUNCTION PANEL
ALBUMIN: 4.3 g/dL (ref 3.5–5.2)
ALK PHOS: 48 U/L (ref 39–117)
ALT: 13 U/L (ref 0–35)
AST: 19 U/L (ref 0–37)
Bilirubin, Direct: 0.2 mg/dL (ref 0.0–0.3)
TOTAL PROTEIN: 7.1 g/dL (ref 6.0–8.3)
Total Bilirubin: 0.8 mg/dL (ref 0.2–1.2)

## 2016-09-26 LAB — LIPID PANEL
CHOLESTEROL: 191 mg/dL (ref 0–200)
HDL: 81.6 mg/dL (ref >39.00)
LDL CALC: 97 mg/dL (ref 0–99)
NonHDL: 109.85
TRIGLYCERIDES: 62 mg/dL (ref 0.0–149.0)
Total CHOL/HDL Ratio: 2
VLDL: 12.4 mg/dL (ref 0.0–40.0)

## 2016-09-26 LAB — TSH: TSH: 2.5 u[IU]/mL (ref 0.35–4.50)

## 2016-09-26 MED ORDER — LEVOTHYROXINE SODIUM 75 MCG PO TABS: ORAL_TABLET | ORAL | 3 refills | Status: DC

## 2016-09-26 MED ORDER — ALENDRONATE SODIUM 70 MG PO TABS: ORAL_TABLET | ORAL | 3 refills | Status: DC

## 2016-09-26 MED ORDER — VITAMIN D3 50 MCG (2000 UT) PO CAPS: 2000.0000 [IU] | ORAL_CAPSULE | Freq: Every day | ORAL | 3 refills | Status: DC

## 2016-09-26 NOTE — Assessment & Plan Note (Signed)
  On diet  

## 2016-09-26 NOTE — Patient Instructions (Addendum)
Valerian root for sleep 

## 2016-09-26 NOTE — Assessment & Plan Note (Signed)
Dr Kinnie ScalesMedoff

## 2016-09-26 NOTE — Progress Notes (Signed)
Subjective:  Patient ID: Carol Mata, female    DOB: 05-21-1956  Age: 60 y.o. MRN: [id number]  CC: Establish Care   HPI Carol Mata presents for a well exam - new pt  Outpatient Medications Prior to Visit  Medication Sig Dispense Refill  . alendronate (FOSAMAX) 70 MG tablet TAKE 1 TABLET BY MOUTH EVERY 7 DAYS. TAKE WITH A FULL GLASS OF WATER ON AN EMPTY STOMACH. 4 tablet 12  . SYNTHROID 75 MCG tablet TAKE ONE TABLET EACH DAY BEFORE BREAKFAST 30 tablet 0  . zolpidem (AMBIEN) 5 MG tablet Take 1 tablet (5 mg total) by mouth at bedtime as needed for sleep. 30 tablet 2  . colestipol (COLESTID) 1 g tablet Take 1 tablet (1 g total) by mouth 2 (two) times daily. (Patient not taking: Reported on 09/26/2016) 60 tablet 3  . fluticasone (FLONASE) 50 MCG/ACT nasal spray Place 2 sprays into the nose as needed. (Patient not taking: Reported on 09/26/2016) 16 g 4   No facility-administered medications prior to visit.     ROS Review of Systems  Constitutional: Negative for activity change, appetite change, chills, fatigue and unexpected weight change.  HENT: Negative for congestion, mouth sores and sinus pressure.   Eyes: Negative for visual disturbance.  Respiratory: Negative for cough and chest tightness.   Gastrointestinal: Negative for abdominal pain and nausea.  Genitourinary: Negative for difficulty urinating, frequency and vaginal pain.  Musculoskeletal: Negative for back pain and gait problem.  Skin: Negative for pallor and rash.  Neurological: Negative for dizziness, tremors, weakness, numbness and headaches.  Psychiatric/Behavioral: Positive for sleep disturbance. Negative for confusion and suicidal ideas.    Objective:  BP 102/70   Pulse 72   Ht 5\' 7"  (1.702 m)   Wt 117 lb (53.1 kg)   SpO2 99%   BMI 18.32 kg/m   BP Readings from Last 3 Encounters:  09/26/16 102/70  08/19/16 118/70  09/13/15 120/66    Wt Readings from Last 3 Encounters:  09/26/16 117 lb (53.1 kg)    09/13/15 117 lb (53.1 kg)  08/31/14 121 lb (54.9 kg)    Physical Exam  Constitutional: She appears well-developed. No distress.  HENT:  Head: Normocephalic.  Right Ear: External ear normal.  Left Ear: External ear normal.  Nose: Nose normal.  Mouth/Throat: Oropharynx is clear and moist.  Eyes: Conjunctivae are normal. Pupils are equal, round, and reactive to light. Right eye exhibits no discharge. Left eye exhibits no discharge.  Neck: Normal range of motion. Neck supple. No JVD present. No tracheal deviation present. No thyromegaly present.  Cardiovascular: Normal rate, regular rhythm and normal heart sounds.   Pulmonary/Chest: No stridor. No respiratory distress. She has no wheezes.  Abdominal: Soft. Bowel sounds are normal. She exhibits no distension and no mass. There is no tenderness. There is no rebound and no guarding.  Musculoskeletal: She exhibits no edema or tenderness.  Lymphadenopathy:    She has no cervical adenopathy.  Neurological: She displays normal reflexes. No cranial nerve deficit. She exhibits normal muscle tone. Coordination normal.  Skin: No rash noted. No erythema.  Psychiatric: She has a normal mood and affect. Her behavior is normal. Judgment and thought content normal.  wax R>>L   Procedure Note :     Procedure :  Ear irrigation   Indication:  Cerumen impaction R   Risks, including pain, dizziness, eardrum perforation, bleeding, infection and others as well as benefits were explained to the patient in detail. Verbal consent  was obtained and the patient agreed to proceed.    We used "The Elephant Ear Irrigation Device" filled with lukewarm water for irrigation. A large amount wax was recovered. Procedure has also required manual wax removal with an ear loop.   Tolerated well. Complications: None.   Postprocedure instructions :  Call if problems.    Lab Results  Component Value Date   WBC 3.8 (L) 09/13/2015   HGB 13.1 09/13/2015   HCT 37.1  09/13/2015   PLT 168 09/13/2015   GLUCOSE 76 09/13/2015   CHOL 187 09/13/2015   TRIG 58 09/13/2015   HDL 77 09/13/2015   LDLCALC 98 09/13/2015   ALT 18 09/13/2015   AST 27 09/13/2015   NA 139 09/13/2015   K 4.4 09/13/2015   CL 103 09/13/2015   CREATININE 0.67 09/13/2015   BUN 12 09/13/2015   CO2 29 09/13/2015   TSH 3.153 09/13/2015    Mm Screening Breast Tomo Bilateral  Result Date: 07/25/2016 CLINICAL DATA:  Screening. EXAM: 2D DIGITAL SCREENING BILATERAL MAMMOGRAM WITH CAD AND ADJUNCT TOMO COMPARISON:  Previous exam(s). ACR Breast Density Category d: The breast tissue is extremely dense, which lowers the sensitivity of mammography. FINDINGS: There are no findings suspicious for malignancy. Images were processed with CAD. IMPRESSION: No mammographic evidence of malignancy. A result letter of this screening mammogram will be mailed directly to the patient. RECOMMENDATION: Screening mammogram in one year. (Code:SM-B-01Y) BI-RADS CATEGORY  1: Negative. Electronically Signed   By: Edwin CapJennifer  Jarosz M.D.   On: 07/25/2016 17:01    Assessment & Plan:   There are no diagnoses linked to this encounter. I am having Ms. Magee maintain her fluticasone, alendronate, zolpidem, colestipol, and SYNTHROID.  No orders of the defined types were placed in this encounter.    Follow-up: No Follow-up on file.  Sonda PrimesAlex Tachina Spoonemore, MD

## 2016-09-26 NOTE — Assessment & Plan Note (Addendum)
Fosamax x 2013 Vit D  Yoga

## 2016-09-26 NOTE — Progress Notes (Signed)
Pre visit review using our clinic review tool, if applicable. No additional management support is needed unless otherwise documented below in the visit note. 

## 2016-09-27 LAB — HEPATITIS C ANTIBODY: HCV Ab: NEGATIVE

## 2016-09-27 LAB — VITAMIN D 25 HYDROXY (VIT D DEFICIENCY, FRACTURES): Vit D, 25-Hydroxy: 28 ng/mL — ABNORMAL LOW (ref 30–100)

## 2016-09-29 ENCOUNTER — Encounter: Payer: Self-pay | Admitting: Gynecology

## 2016-09-29 ENCOUNTER — Ambulatory Visit (INDEPENDENT_AMBULATORY_CARE_PROVIDER_SITE_OTHER): Payer: Managed Care, Other (non HMO) | Admitting: Gynecology

## 2016-09-29 VITALS — BP 112/70 | Ht 67.0 in | Wt 117.0 lb

## 2016-09-29 DIAGNOSIS — N952 Postmenopausal atrophic vaginitis: Secondary | ICD-10-CM

## 2016-09-29 DIAGNOSIS — Z01411 Encounter for gynecological examination (general) (routine) with abnormal findings: Secondary | ICD-10-CM | POA: Diagnosis not present

## 2016-09-29 DIAGNOSIS — G4700 Insomnia, unspecified: Secondary | ICD-10-CM

## 2016-09-29 DIAGNOSIS — M810 Age-related osteoporosis without current pathological fracture: Secondary | ICD-10-CM | POA: Diagnosis not present

## 2016-09-29 MED ORDER — ZOLPIDEM TARTRATE 5 MG PO TABS: 5.0000 mg | ORAL_TABLET | Freq: Every evening | ORAL | 2 refills | Status: DC | PRN

## 2016-09-29 MED ORDER — ALENDRONATE SODIUM 70 MG PO TABS: ORAL_TABLET | ORAL | 3 refills | Status: DC

## 2016-09-29 NOTE — Patient Instructions (Signed)
Follow up for your bone density as scheduled. Follow up for repeat vitamin D level in several months.  You may obtain a copy of any labs that were done today by logging onto MyChart as outlined in the instructions provided with your AVS (after visit summary). The office will not call with normal lab results but certainly if there are any significant abnormalities then we will contact you.   Health Maintenance Adopting a healthy lifestyle and getting preventive care can go a long way to promote health and wellness. Talk with your health care provider about what schedule of regular examinations is right for you. This is a good chance for you to check in with your provider about disease prevention and staying healthy. In between checkups, there are plenty of things you can do on your own. Experts have done a lot of research about which lifestyle changes and preventive measures are most likely to keep you healthy. Ask your health care provider for more information. WEIGHT AND DIET  Eat a healthy diet  Be sure to include plenty of vegetables, fruits, low-fat dairy products, and lean protein.  Do not eat a lot of foods high in solid fats, added sugars, or salt.  Get regular exercise. This is one of the most important things you can do for your health.  Most adults should exercise for at least 150 minutes each week. The exercise should increase your heart rate and make you sweat (moderate-intensity exercise).  Most adults should also do strengthening exercises at least twice a week. This is in addition to the moderate-intensity exercise.  Maintain a healthy weight  Body mass index (BMI) is a measurement that can be used to identify possible weight problems. It estimates body fat based on height and weight. Your health care provider can help determine your BMI and help you achieve or maintain a healthy weight.  For females 33 years of age and older:   A BMI below 18.5 is considered  underweight.  A BMI of 18.5 to 24.9 is normal.  A BMI of 25 to 29.9 is considered overweight.  A BMI of 30 and above is considered obese.  Watch levels of cholesterol and blood lipids  You should start having your blood tested for lipids and cholesterol at 59 years of age, then have this test every 5 years.  You may need to have your cholesterol levels checked more often if:  Your lipid or cholesterol levels are high.  You are older than 59 years of age.  You are at high risk for heart disease.  CANCER SCREENING   Lung Cancer  Lung cancer screening is recommended for adults 73-59 years old who are at high risk for lung cancer because of a history of smoking.  A yearly low-dose CT scan of the lungs is recommended for people who:  Currently smoke.  Have quit within the past 15 years.  Have at least a 30-pack-year history of smoking. A pack year is smoking an average of one pack of cigarettes a day for 1 year.  Yearly screening should continue until it has been 15 years since you quit.  Yearly screening should stop if you develop a health problem that would prevent you from having lung cancer treatment.  Breast Cancer  Practice breast self-awareness. This means understanding how your breasts normally appear and feel.  It also means doing regular breast self-exams. Let your health care provider know about any changes, no matter how small.  If you are in  your 20s or 30s, you should have a clinical breast exam (CBE) by a health care provider every 1-3 years as part of a regular health exam.  If you are 39 or older, have a CBE every year. Also consider having a breast X-ray (mammogram) every year.  If you have a family history of breast cancer, talk to your health care provider about genetic screening.  If you are at high risk for breast cancer, talk to your health care provider about having an MRI and a mammogram every year.  Breast cancer gene (BRCA) assessment is  recommended for women who have family members with BRCA-related cancers. BRCA-related cancers include:  Breast.  Ovarian.  Tubal.  Peritoneal cancers.  Results of the assessment will determine the need for genetic counseling and BRCA1 and BRCA2 testing. Cervical Cancer Routine pelvic examinations to screen for cervical cancer are no longer recommended for nonpregnant women who are considered low risk for cancer of the pelvic organs (ovaries, uterus, and vagina) and who do not have symptoms. A pelvic examination may be necessary if you have symptoms including those associated with pelvic infections. Ask your health care provider if a screening pelvic exam is right for you.   The Pap test is the screening test for cervical cancer for women who are considered at risk.  If you had a hysterectomy for a problem that was not cancer or a condition that could lead to cancer, then you no longer need Pap tests.  If you are older than 59 years, and you have had normal Pap tests for the past 10 years, you no longer need to have Pap tests.  If you have had past treatment for cervical cancer or a condition that could lead to cancer, you need Pap tests and screening for cancer for at least 20 years after your treatment.  If you no longer get a Pap test, assess your risk factors if they change (such as having a new sexual partner). This can affect whether you should start being screened again.  Some women have medical problems that increase their chance of getting cervical cancer. If this is the case for you, your health care provider may recommend more frequent screening and Pap tests.  The human papillomavirus (HPV) test is another test that may be used for cervical cancer screening. The HPV test looks for the virus that can cause cell changes in the cervix. The cells collected during the Pap test can be tested for HPV.  The HPV test can be used to screen women 59 years of age and older. Getting tested  for HPV can extend the interval between normal Pap tests from three to five years.  An HPV test also should be used to screen women of any age who have unclear Pap test results.  After 59 years of age, women should have HPV testing as often as Pap tests.  Colorectal Cancer  This type of cancer can be detected and often prevented.  Routine colorectal cancer screening usually begins at 59 years of age and continues through 59 years of age.  Your health care provider may recommend screening at an earlier age if you have risk factors for colon cancer.  Your health care provider may also recommend using home test kits to check for hidden blood in the stool.  A small camera at the end of a tube can be used to examine your colon directly (sigmoidoscopy or colonoscopy). This is done to check for the earliest forms of  colorectal cancer.  Routine screening usually begins at age 66.  Direct examination of the colon should be repeated every 5-10 years through 59 years of age. However, you may need to be screened more often if early forms of precancerous polyps or small growths are found. Skin Cancer  Check your skin from head to toe regularly.  Tell your health care provider about any new moles or changes in moles, especially if there is a change in a mole's shape or color.  Also tell your health care provider if you have a mole that is larger than the size of a pencil eraser.  Always use sunscreen. Apply sunscreen liberally and repeatedly throughout the day.  Protect yourself by wearing long sleeves, pants, a wide-brimmed hat, and sunglasses whenever you are outside. HEART DISEASE, DIABETES, AND HIGH BLOOD PRESSURE   Have your blood pressure checked at least every 1-2 years. High blood pressure causes heart disease and increases the risk of stroke.  If you are between 55 years and 55 years old, ask your health care provider if you should take aspirin to prevent strokes.  Have regular  diabetes screenings. This involves taking a blood sample to check your fasting blood sugar level.  If you are at a normal weight and have a low risk for diabetes, have this test once every three years after 59 years of age.  If you are overweight and have a high risk for diabetes, consider being tested at a younger age or more often. PREVENTING INFECTION  Hepatitis B  If you have a higher risk for hepatitis B, you should be screened for this virus. You are considered at high risk for hepatitis B if:  You were born in a country where hepatitis B is common. Ask your health care provider which countries are considered high risk.  Your parents were born in a high-risk country, and you have not been immunized against hepatitis B (hepatitis B vaccine).  You have HIV or AIDS.  You use needles to inject street drugs.  You live with someone who has hepatitis B.  You have had sex with someone who has hepatitis B.  You get hemodialysis treatment.  You take certain medicines for conditions, including cancer, organ transplantation, and autoimmune conditions. Hepatitis C  Blood testing is recommended for:  Everyone born from [birth year] through 1964.  Anyone with known risk factors for hepatitis C. Sexually transmitted infections (STIs)  You should be screened for sexually transmitted infections (STIs) including gonorrhea and chlamydia if:  You are sexually active and are younger than 59 years of age.  You are older than 59 years of age and your health care provider tells you that you are at risk for this type of infection.  Your sexual activity has changed since you were last screened and you are at an increased risk for chlamydia or gonorrhea. Ask your health care provider if you are at risk.  If you do not have HIV, but are at risk, it may be recommended that you take a prescription medicine daily to prevent HIV infection. This is called pre-exposure prophylaxis (PrEP). You are considered at  risk if:  You are sexually active and do not regularly use condoms or know the HIV status of your partner(s).  You take drugs by injection.  You are sexually active with a partner who has HIV. Talk with your health care provider about whether you are at high risk of being infected with HIV. If you choose to begin PrEP,  you should first be tested for HIV. You should then be tested every 3 months for as long as you are taking PrEP.  PREGNANCY   If you are premenopausal and you may become pregnant, ask your health care provider about preconception counseling.  If you may become pregnant, take 400 to 800 micrograms (mcg) of folic acid every day.  If you want to prevent pregnancy, talk to your health care provider about birth control (contraception). OSTEOPOROSIS AND MENOPAUSE   Osteoporosis is a disease in which the bones lose minerals and strength with aging. This can result in serious bone fractures. Your risk for osteoporosis can be identified using a bone density scan.  If you are 79 years of age or older, or if you are at risk for osteoporosis and fractures, ask your health care provider if you should be screened.  Ask your health care provider whether you should take a calcium or vitamin D supplement to lower your risk for osteoporosis.  Menopause may have certain physical symptoms and risks.  Hormone replacement therapy may reduce some of these symptoms and risks. Talk to your health care provider about whether hormone replacement therapy is right for you.  HOME CARE INSTRUCTIONS   Schedule regular health, dental, and eye exams.  Stay current with your immunizations.   Do not use any tobacco products including cigarettes, chewing tobacco, or electronic cigarettes.  If you are pregnant, do not drink alcohol.  If you are breastfeeding, limit how much and how often you drink alcohol.  Limit alcohol intake to no more than 1 drink per day for nonpregnant women. One drink equals  12 ounces of beer, 5 ounces of wine, or 1 ounces of hard liquor.  Do not use street drugs.  Do not share needles.  Ask your health care provider for help if you need support or information about quitting drugs.  Tell your health care provider if you often feel depressed.  Tell your health care provider if you have ever been abused or do not feel safe at home. Document Released: 04/28/2011 Document Revised: 02/27/2014 Document Reviewed: 09/14/2013 [facility] Patient Information 2015 [place], [place]. This information is not intended to replace advice given to you by your health care provider. Make sure you discuss any questions you have with your health care provider.

## 2016-09-29 NOTE — Progress Notes (Signed)
    Carol KarvonenGail I Mata June 30, 1956 [id number]        60 y.o.  [id number]  for annual exam.    Past medical history,surgical history, problem list, medications, allergies, family history and social history were all reviewed and documented as reviewed in the EPIC chart.  ROS:  Performed with pertinent positives and negatives included in the history, assessment and plan.   Additional significant findings :  None   Exam: Kennon PortelaKim Gardner assistant Vitals:   09/29/16 0828  BP: 112/70  Weight: 117 lb (53.1 kg)  Height: 5\' 7"  (1.702 m)   Body mass index is 18.32 kg/m.  General appearance:  Normal affect, orientation and appearance. Skin: Grossly normal HEENT: Without gross lesions.  No cervical or supraclavicular adenopathy. Thyroid normal.  Lungs:  Clear without wheezing, rales or rhonchi Cardiac: RR, without RMG Abdominal:  Soft, nontender, without masses, guarding, rebound, organomegaly or hernia Breasts:  Examined lying and sitting without masses, retractions, discharge or axillary adenopathy. Pelvic:  Ext, BUS, Vagina with atrophic changes  Cervix with atrophic changes  Uterus anteverted, normal size, shape and contour, midline and mobile nontender   Adnexa without masses or tenderness    Anus and perineum normal   Rectovaginal normal sphincter tone without palpated masses or tenderness.    Assessment/Plan:  60 y.o. [id number] female for annual exam.   1. Postmenopausal/atrophic genital changes. No significant hot flushes, night sweats, vaginal dryness or any vaginal bleeding. Continue to monitor report any issues or vaginal bleeding. 2. Osteoporosis. DEXA 12/2014 T score -2.2. DEXA 2014 T score -2.5. On alendronate 3 years. Continue alendronate now and repeat DEXA beginning of next year at 2 year interval. Will plan for a 5 year course and then drug free holiday. Recent vitamin D 28. Has been on 2000 units daily but admits to not taking regularly. We'll start to take regularly and  recommended repeat vitamin D level in several months with future order placed. Patient agrees to return for this. 3. Mammography 06/2016. Continue with annual mammography. 4. Colonoscopy thousand 15. Repeat at their recommended interval. 5. Pap smear 2016. No Pap smear done today. Plan repeat Pap smear at 3 year interval per current screening guidelines. 6. History of insomnia. Uses Ambien 5 mg as needed. #30 with 2 refills provided. 7. Health maintenance. Has established care with Dr. Posey ReaPlotnikov who is now doing her routine blood work and following her thyroid. Follow up for vitamin D level rechecked in several months and DEXA beginning of next year otherwise annual exam in one year.   Dara LordsFONTAINE,Carol Mata P MD, 8:53 AM 09/29/2016

## 2016-11-28 ENCOUNTER — Encounter: Payer: Self-pay | Admitting: Internal Medicine

## 2016-12-15 ENCOUNTER — Encounter: Payer: Self-pay | Admitting: Internal Medicine

## 2016-12-19 ENCOUNTER — Encounter: Payer: Self-pay | Admitting: Family Medicine

## 2016-12-19 ENCOUNTER — Ambulatory Visit (INDEPENDENT_AMBULATORY_CARE_PROVIDER_SITE_OTHER): Payer: Managed Care, Other (non HMO) | Admitting: Family Medicine

## 2016-12-19 VITALS — BP 112/76 | HR 76 | Temp 98.3°F | Wt 118.8 lb

## 2016-12-19 DIAGNOSIS — R6889 Other general symptoms and signs: Secondary | ICD-10-CM | POA: Diagnosis not present

## 2016-12-19 NOTE — Patient Instructions (Signed)
Your symptoms are most likely related to a viral illness. Please drink plenty of water so that your urine is pale yellow or clear. Also, get plenty of rest, use tylenol  as needed for discomfort and you can add Mucinex DM for cough. Follow up if symptoms do not improve in 3 to 4 days, worsen, or you develop a fever >101.  Please complete antibiotic prescribed by your provider.

## 2016-12-19 NOTE — Progress Notes (Signed)
Patient ID: Carol KarvonenGail I Neff, female   DOB: 1956/05/18, 61 y.o.   MRN: [id number]  PCP: Sonda PrimesAlex Plotnikov, MD  Subjective:  Carol KarvonenGail I Sroka is a 61 y.o. year old very pleasant female patient who presents with flu like symptoms including fever,body aches.  -other symptoms and sinus pressure. Sinus pressure has improved   -started: 6 days ago -inside 48 hour treatment window if needed for tamiflu: no -high risk condition: No history of history/bronchitis -symptoms are improving through sinus pressure/pain as she was recently treated with an antibiotic 2 days ago. -previous treatments: Pseudoephedrine and tylenol, Nyquil have provided moderate benefit. - patient did receive flu shot this year.  - Recent sick contact at school;   ROS-denies SOB, NVD, or dental pain  Pertinent Past Medical History-  Patient Active Problem List   Diagnosis Date Noted  . IBS (irritable bowel syndrome) 09/26/2016  . Lactose intolerance 09/26/2016  . Osteoporosis 09/26/2016  . Vertigo   . Atrophic vaginitis   . Thyroid disease     Medications- reviewed  Current Outpatient Prescriptions  Medication Sig Dispense Refill  . alendronate (FOSAMAX) 70 MG tablet TAKE 1 TABLET BY MOUTH EVERY 7 DAYS. TAKE WITH A FULL GLASS OF WATER ON AN EMPTY STOMACH. 12 tablet 3  . Cholecalciferol (VITAMIN D3) 2000 units capsule Take 1 capsule (2,000 Units total) by mouth daily. 100 capsule 3  . levothyroxine (SYNTHROID) 75 MCG tablet TAKE ONE TABLET EACH DAY BEFORE BREAKFAST 90 tablet 3  . zolpidem (AMBIEN) 5 MG tablet Take 1 tablet (5 mg total) by mouth at bedtime as needed for sleep. 30 tablet 2  . fluticasone (FLONASE) 50 MCG/ACT nasal spray Place 2 sprays into the nose as needed. (Patient not taking: Reported on 09/26/2016) 16 g 4   No current facility-administered medications for this visit.     Objective: BP 112/76 (BP Location: Left Arm, Patient Position: Sitting, Cuff Size: Normal)   Pulse 76   Temp 98.3 F (36.8 C)  (Oral)   Wt 118 lb 12.8 oz (53.9 kg)   SpO2 97%   BMI 18.61 kg/m  Gen: NAD, appears fatigued HEENT: Turbinates erythematous, TM normal, pharynx mildly erythematous with no tonsilar exudate or edema, no sinus tenderness CV: RRR no murmurs rubs or gallops Lungs: CTAB no crackles, wheeze, rhonchi Abdomen: soft/nontender/nondistended/normal bowel sounds. Ext: no edema Skin: warm, dry, no rash  Assessment/Plan:  Flu-like illness:  History and exam today are suggestive of viral process. Did not test for influenza today as she is outside of the window for Tamiflu and she is being treated for sinusitis and reports symptoms while still present are improving.. High risk condition:no  Patient will not be  treated with Tamiflu.  Symptomatic treatment with: Advised patient on supportive measures:  Get rest, drink plenty of fluids, and use tylenol as needed for pain. Mucinex DM for cough.  Follow up if fever >101, if symptoms worsen or if symptoms are not improved in 3 to 4 days. Patient verbalizes understanding.     Finally, we reviewed reasons to return to care including if symptoms worsen or persist or new concerns arise.    Inez CatalinaJulia Ann Zierra Laroque, FNP

## 2017-01-15 ENCOUNTER — Ambulatory Visit (INDEPENDENT_AMBULATORY_CARE_PROVIDER_SITE_OTHER): Payer: Managed Care, Other (non HMO)

## 2017-01-15 ENCOUNTER — Other Ambulatory Visit: Payer: Self-pay | Admitting: Gynecology

## 2017-01-15 ENCOUNTER — Encounter: Payer: Self-pay | Admitting: Gynecology

## 2017-01-15 ENCOUNTER — Other Ambulatory Visit: Payer: Self-pay | Admitting: *Deleted

## 2017-01-15 ENCOUNTER — Other Ambulatory Visit: Payer: Managed Care, Other (non HMO)

## 2017-01-15 DIAGNOSIS — M810 Age-related osteoporosis without current pathological fracture: Secondary | ICD-10-CM

## 2017-01-15 DIAGNOSIS — E559 Vitamin D deficiency, unspecified: Secondary | ICD-10-CM

## 2017-01-15 LAB — VITAMIN D 25 HYDROXY (VIT D DEFICIENCY, FRACTURES): VIT D 25 HYDROXY: 32 ng/mL (ref 30–100)

## 2017-04-10 ENCOUNTER — Encounter: Payer: Self-pay | Admitting: Family Medicine

## 2017-04-10 ENCOUNTER — Ambulatory Visit (INDEPENDENT_AMBULATORY_CARE_PROVIDER_SITE_OTHER): Payer: Managed Care, Other (non HMO) | Admitting: Family Medicine

## 2017-04-10 VITALS — BP 100/60 | HR 89 | Temp 99.9°F | Ht 67.0 in | Wt 120.8 lb

## 2017-04-10 DIAGNOSIS — M791 Myalgia, unspecified site: Secondary | ICD-10-CM

## 2017-04-10 DIAGNOSIS — B349 Viral infection, unspecified: Secondary | ICD-10-CM

## 2017-04-10 LAB — POC INFLUENZA A&B (BINAX/QUICKVUE)
INFLUENZA B, POC: NEGATIVE
Influenza A, POC: NEGATIVE

## 2017-04-10 NOTE — Progress Notes (Signed)
Subjective:     Patient ID: Carol KarvonenGail I Kobashigawa, female   DOB: Oct 05, 1956, 61 y.o.   MRN: [id number]  HPI Patient seen for acute visit. She basically woke up this morning with some headache along with malaise and generalized body aches. She also noticed some bilateral lower back pain. No flank pain. She's had some nasal congestion and sore throat. Minimal cough.  No burning with urination. Denies any nausea, vomiting, or diarrhea. She's just got back from trip to [place]. No sick contacts.  Past Medical History:  Diagnosis Date  . ASCUS (atypical squamous cells of undetermined significance) on Pap smear   . Lactose intolerance   . Osteoporosis 12/2016   T score -1.8 2014 T score -2.5. 2016 T score -2.2 statistically significant improvement AP spine and left hip, right hip stable  . Sinusitis   . Thyroid disease    Hypothyroid  . Vertigo    Past Surgical History:  Procedure Laterality Date  . BUNIONECTOMY    . DILATION AND CURETTAGE OF UTERUS    . HYSTEROSCOPY  [id number]  . NASAL SINUS SURGERY    . PELVIC LAPAROSCOPY     DL-Rt salpingectomy    reports that she has quit smoking. She has never used smokeless tobacco. She reports that she drinks about 8.4 oz of alcohol per week . She reports that she does not use drugs. family history includes Cancer in her father and maternal aunt; Heart disease in her father; Hypertension in her father. Allergies  Allergen Reactions  . Lactose Intolerance (Gi)      Review of Systems  Constitutional: Positive for fatigue. Negative for fever.  HENT: Positive for congestion and sore throat.   Respiratory: Positive for cough. Negative for shortness of breath and wheezing.   Cardiovascular: Negative for chest pain.  Gastrointestinal: Negative for abdominal pain, diarrhea, nausea and vomiting.  Genitourinary: Negative for dysuria.  Musculoskeletal: Positive for myalgias.       Objective:   Physical Exam  Constitutional: She appears well-developed  and well-nourished. No distress.  HENT:  Right Ear: External ear normal.  Left Ear: External ear normal.  Mouth/Throat: Oropharynx is clear and moist.  Neck: Neck supple.  Cardiovascular: Normal rate.   Pulmonary/Chest: Effort normal and breath sounds normal. No respiratory distress. She has no wheezes. She has no rales.  Lymphadenopathy:    She has no cervical adenopathy.  Neurological: She is alert.  Skin: No rash noted.       Assessment:     Probable viral syndrome. Patient requesting influenza screening. She does have flulike symptoms but explained this would be very unlikely given time of year.     Plan:     -Check influenza screen=negative. - Treat symptomatically-hydrate, rest, Ibuprofen/Tylenol.    Kristian CoveyBruce W Rasaan Brotherton MD [facility]

## 2017-04-10 NOTE — Patient Instructions (Signed)
Viral Illness, Adult Viruses are tiny germs that can get into a person's body and cause illness. There are many different types of viruses, and they cause many types of illness. Viral illnesses can range from mild to severe. They can affect various parts of the body. Common illnesses that are caused by a virus include colds and the flu. Viral illnesses also include serious conditions such as HIV/AIDS (human immunodeficiency virus/acquired immunodeficiency syndrome). A few viruses have been linked to certain cancers. What are the causes? Many types of viruses can cause illness. Viruses invade cells in your body, multiply, and cause the infected cells to malfunction or die. When the cell dies, it releases more of the virus. When this happens, you develop symptoms of the illness, and the virus continues to spread to other cells. If the virus takes over the function of the cell, it can cause the cell to divide and grow out of control, as is the case when a virus causes cancer. Different viruses get into the body in different ways. You can get a virus by:  Swallowing food or water that is contaminated with the virus.  Breathing in droplets that have been coughed or sneezed into the air by an infected person.  Touching a surface that has been contaminated with the virus and then touching your eyes, nose, or mouth.  Being bitten by an insect or animal that carries the virus.  Having sexual contact with a person who is infected with the virus.  Being exposed to blood or fluids that contain the virus, either through an open cut or during a transfusion.  If a virus enters your body, your body's defense system (immune system) will try to fight the virus. You may be at higher risk for a viral illness if your immune system is weak. What are the signs or symptoms? Symptoms vary depending on the type of virus and the location of the cells that it invades. Common symptoms of the main types of viral illnesses  include: Cold and flu viruses  Fever.  Headache.  Sore throat.  Muscle aches.  Nasal congestion.  Cough. Digestive system (gastrointestinal) viruses  Fever.  Abdominal pain.  Nausea.  Diarrhea. Liver viruses (hepatitis)  Loss of appetite.  Tiredness.  Yellowing of the skin (jaundice). Brain and spinal cord viruses  Fever.  Headache.  Stiff neck.  Nausea and vomiting.  Confusion or sleepiness. Skin viruses  Warts.  Itching.  Rash. Sexually transmitted viruses  Discharge.  Swelling.  Redness.  Rash. How is this treated? Viruses can be difficult to treat because they live within cells. Antibiotic medicines do not treat viruses because these drugs do not get inside cells. Treatment for a viral illness may include:  Resting and drinking plenty of fluids.  Medicines to relieve symptoms. These can include over-the-counter medicine for pain and fever, medicines for cough or congestion, and medicines to relieve diarrhea.  Antiviral medicines. These drugs are available only for certain types of viruses. They may help reduce flu symptoms if taken early. There are also many antiviral medicines for hepatitis and HIV/AIDS.  Some viral illnesses can be prevented with vaccinations. A common example is the flu shot. Follow these instructions at home: Medicines   Take over-the-counter and prescription medicines only as told by your health care provider.  If you were prescribed an antiviral medicine, take it as told by your health care provider. Do not stop taking the medicine even if you start to feel better.  Be aware   of when antibiotics are needed and when they are not needed. Antibiotics do not treat viruses. If your health care provider thinks that you may have a bacterial infection as well as a viral infection, you may get an antibiotic. ? Do not ask for an antibiotic prescription if you have been diagnosed with a viral illness. That will not make your  illness go away faster. ? Frequently taking antibiotics when they are not needed can lead to antibiotic resistance. When this develops, the medicine no longer works against the bacteria that it normally fights. General instructions  Drink enough fluids to keep your urine clear or pale yellow.  Rest as much as possible.  Return to your normal activities as told by your health care provider. Ask your health care provider what activities are safe for you.  Keep all follow-up visits as told by your health care provider. This is important. How is this prevented? Take these actions to reduce your risk of viral infection:  Eat a healthy diet and get enough rest.  Wash your hands often with soap and water. This is especially important when you are in public places. If soap and water are not available, use hand sanitizer.  Avoid close contact with friends and family who have a viral illness.  If you travel to areas where viral gastrointestinal infection is common, avoid drinking water or eating raw food.  Keep your immunizations up to date. Get a flu shot every year as told by your health care provider.  Do not share toothbrushes, nail clippers, razors, or needles with other people.  Always practice safe sex.  Contact a health care provider if:  You have symptoms of a viral illness that do not go away.  Your symptoms come back after going away.  Your symptoms get worse. Get help right away if:  You have trouble breathing.  You have a severe headache or a stiff neck.  You have severe vomiting or abdominal pain. This information is not intended to replace advice given to you by your health care provider. Make sure you discuss any questions you have with your health care provider. Document Released: 02/22/2016 Document Revised: 03/26/2016 Document Reviewed: 02/22/2016 Elsevier Interactive Patient Education  2018 [employer]  

## 2017-04-12 LAB — POCT INFLUENZA A/B
Influenza A, POC: NEGATIVE
Influenza B, POC: NEGATIVE

## 2017-04-16 ENCOUNTER — Other Ambulatory Visit: Payer: Self-pay | Admitting: Gynecology

## 2017-04-16 NOTE — Telephone Encounter (Signed)
Called into pharmacy

## 2017-06-09 ENCOUNTER — Telehealth: Payer: Self-pay | Admitting: *Deleted

## 2017-06-09 NOTE — Telephone Encounter (Signed)
Pt had question about next pap, I called and left message on pt voicemail to call me.

## 2017-09-28 ENCOUNTER — Ambulatory Visit (INDEPENDENT_AMBULATORY_CARE_PROVIDER_SITE_OTHER): Payer: Managed Care, Other (non HMO) | Admitting: Internal Medicine

## 2017-09-28 ENCOUNTER — Other Ambulatory Visit (INDEPENDENT_AMBULATORY_CARE_PROVIDER_SITE_OTHER): Payer: Managed Care, Other (non HMO)

## 2017-09-28 ENCOUNTER — Encounter: Payer: Self-pay | Admitting: Internal Medicine

## 2017-09-28 DIAGNOSIS — Z0000 Encounter for general adult medical examination without abnormal findings: Secondary | ICD-10-CM | POA: Diagnosis not present

## 2017-09-28 LAB — CBC WITH DIFFERENTIAL/PLATELET
BASOS PCT: 0.8 % (ref 0.0–3.0)
Basophils Absolute: 0 10*3/uL (ref 0.0–0.1)
EOS ABS: 0.1 10*3/uL (ref 0.0–0.7)
EOS PCT: 1.1 % (ref 0.0–5.0)
HCT: 39.4 % (ref 36.0–46.0)
Hemoglobin: 13.3 g/dL (ref 12.0–15.0)
LYMPHS ABS: 1.5 10*3/uL (ref 0.7–4.0)
Lymphocytes Relative: 30.9 % (ref 12.0–46.0)
MCHC: 33.7 g/dL (ref 30.0–36.0)
MCV: 89.1 fl (ref 78.0–100.0)
MONO ABS: 0.5 10*3/uL (ref 0.1–1.0)
Monocytes Relative: 9.7 % (ref 3.0–12.0)
NEUTROS PCT: 57.5 % (ref 43.0–77.0)
Neutro Abs: 2.9 10*3/uL (ref 1.4–7.7)
Platelets: 188 10*3/uL (ref 150.0–400.0)
RBC: 4.43 Mil/uL (ref 3.87–5.11)
RDW: 12.5 % (ref 11.5–15.5)
WBC: 5 10*3/uL (ref 4.0–10.5)

## 2017-09-28 LAB — URINALYSIS, ROUTINE W REFLEX MICROSCOPIC
Bilirubin Urine: NEGATIVE
Ketones, ur: NEGATIVE
Nitrite: NEGATIVE
PH: 7.5 (ref 5.0–8.0)
Specific Gravity, Urine: 1.01 (ref 1.000–1.030)
TOTAL PROTEIN, URINE-UPE24: NEGATIVE
URINE GLUCOSE: NEGATIVE
Urobilinogen, UA: 0.2 (ref 0.0–1.0)

## 2017-09-28 LAB — BASIC METABOLIC PANEL
BUN: 11 mg/dL (ref 6–23)
CHLORIDE: 101 meq/L (ref 96–112)
CO2: 29 mEq/L (ref 19–32)
Calcium: 9.7 mg/dL (ref 8.4–10.5)
Creatinine, Ser: 0.76 mg/dL (ref 0.40–1.20)
GFR: 82.31 mL/min (ref >60.00)
GLUCOSE: 87 mg/dL (ref 70–99)
POTASSIUM: 4.3 meq/L (ref 3.5–5.1)
SODIUM: 139 meq/L (ref 135–145)

## 2017-09-28 LAB — LIPID PANEL
Cholesterol: 199 mg/dL (ref 0–200)
HDL: 79.4 mg/dL (ref >39.00)
LDL CALC: 107 mg/dL — AB (ref 0–99)
NONHDL: 119.93
Total CHOL/HDL Ratio: 3
Triglycerides: 66 mg/dL (ref 0.0–149.0)
VLDL: 13.2 mg/dL (ref 0.0–40.0)

## 2017-09-28 LAB — HEPATIC FUNCTION PANEL
ALT: 13 U/L (ref 0–35)
AST: 23 U/L (ref 0–37)
Albumin: 4.7 g/dL (ref 3.5–5.2)
Alkaline Phosphatase: 47 U/L (ref 39–117)
BILIRUBIN DIRECT: 0.1 mg/dL (ref 0.0–0.3)
BILIRUBIN TOTAL: 1 mg/dL (ref 0.2–1.2)
Total Protein: 7.3 g/dL (ref 6.0–8.3)

## 2017-09-28 LAB — TSH: TSH: 5.04 u[IU]/mL — AB (ref 0.35–4.50)

## 2017-09-28 NOTE — Assessment & Plan Note (Addendum)
We discussed age appropriate health related issues, including available/recomended screening tests and vaccinations. We discussed a need for adhering to healthy diet and exercise. Labs were ordered to be later reviewed . All questions were answered. Colon 2015 nl Dr Kinnie ScalesMedoff Shingrix

## 2017-09-28 NOTE — Progress Notes (Signed)
Subjective:  Patient ID: Carol Mata, female    DOB: 12-30-1955  Age: 61 y.o. MRN: [id number]  CC: No chief complaint on file.   HPI Carol Mata presents for a well exam  Outpatient Medications Prior to Visit  Medication Sig Dispense Refill  . alendronate (FOSAMAX) 70 MG tablet TAKE 1 TABLET BY MOUTH EVERY 7 DAYS. TAKE WITH A FULL GLASS OF WATER ON AN EMPTY STOMACH. 12 tablet 3  . Cholecalciferol (VITAMIN D3) 2000 units capsule Take 1 capsule (2,000 Units total) by mouth daily. 100 capsule 3  . fluticasone (FLONASE) 50 MCG/ACT nasal spray Place 2 sprays into the nose as needed. 16 g 4  . levothyroxine (SYNTHROID) 75 MCG tablet TAKE ONE TABLET EACH DAY BEFORE BREAKFAST 90 tablet 3  . zolpidem (AMBIEN) 5 MG tablet TAKE ONE TABLET AT BEDTIME AS NEEDED FOR SLEEP 30 tablet 3   No facility-administered medications prior to visit.     ROS Review of Systems  Constitutional: Negative for activity change, appetite change, chills, fatigue and unexpected weight change.  HENT: Negative for congestion, mouth sores and sinus pressure.   Eyes: Negative for visual disturbance.  Respiratory: Negative for cough and chest tightness.   Gastrointestinal: Negative for abdominal pain and nausea.  Genitourinary: Negative for difficulty urinating, frequency and vaginal pain.  Musculoskeletal: Negative for back pain and gait problem.  Skin: Negative for pallor and rash.  Neurological: Negative for dizziness, tremors, weakness, numbness and headaches.  Psychiatric/Behavioral: Negative for confusion, sleep disturbance and suicidal ideas.    Objective:  BP 110/72 (BP Location: Left Arm, Patient Position: Sitting, Cuff Size: Normal)   Pulse 73   Temp 98.5 F (36.9 C) (Oral)   Ht 5\' 7"  (1.702 m)   Wt 118 lb (53.5 kg)   SpO2 98%   BMI 18.48 kg/m   BP Readings from Last 3 Encounters:  09/28/17 110/72  04/10/17 100/60  12/19/16 112/76    Wt Readings from Last 3 Encounters:  09/28/17 118  lb (53.5 kg)  04/10/17 120 lb 12.8 oz (54.8 kg)  12/19/16 118 lb 12.8 oz (53.9 kg)    Physical Exam  Constitutional: She appears well-developed. No distress.  HENT:  Head: Normocephalic.  Right Ear: External ear normal.  Left Ear: External ear normal.  Nose: Nose normal.  Mouth/Throat: Oropharynx is clear and moist.  Eyes: Conjunctivae are normal. Pupils are equal, round, and reactive to light. Right eye exhibits no discharge. Left eye exhibits no discharge.  Neck: Normal range of motion. Neck supple. No JVD present. No tracheal deviation present. No thyromegaly present.  Cardiovascular: Normal rate, regular rhythm and normal heart sounds.  Pulmonary/Chest: No stridor. No respiratory distress. She has no wheezes.  Abdominal: Soft. Bowel sounds are normal. She exhibits no distension and no mass. There is no tenderness. There is no rebound and no guarding.  Musculoskeletal: She exhibits no edema or tenderness.  Lymphadenopathy:    She has no cervical adenopathy.  Neurological: She displays normal reflexes. No cranial nerve deficit. She exhibits normal muscle tone. Coordination normal.  Skin: No rash noted. No erythema.  Psychiatric: She has a normal mood and affect. Her behavior is normal. Judgment and thought content normal.    Lab Results  Component Value Date   WBC 5.1 09/26/2016   HGB 13.0 09/26/2016   HCT 37.8 09/26/2016   PLT 175.0 09/26/2016   GLUCOSE 79 09/26/2016   CHOL 191 09/26/2016   TRIG 62.0 09/26/2016   HDL 81.60 09/26/2016  LDLCALC 97 09/26/2016   ALT 13 09/26/2016   AST 19 09/26/2016   NA 140 09/26/2016   K 4.7 09/26/2016   CL 103 09/26/2016   CREATININE 0.74 09/26/2016   BUN 16 09/26/2016   CO2 31 09/26/2016   TSH 2.50 09/26/2016    Mm Screening Breast Tomo Bilateral  Result Date: 07/24/2016 CLINICAL DATA:  Screening. EXAM: 2D DIGITAL SCREENING BILATERAL MAMMOGRAM WITH CAD AND ADJUNCT TOMO COMPARISON:  Previous exam(s). ACR Breast Density Category d:  The breast tissue is extremely dense, which lowers the sensitivity of mammography. FINDINGS: There are no findings suspicious for malignancy. Images were processed with CAD. IMPRESSION: No mammographic evidence of malignancy. A result letter of this screening mammogram will be mailed directly to the patient. RECOMMENDATION: Screening mammogram in one year. (Code:SM-B-01Y) BI-RADS CATEGORY  1: Negative. Electronically Signed   By: Edwin CapJennifer  Jarosz M.D.   On: 07/25/2016 17:01    Assessment & Plan:   There are no diagnoses linked to this encounter. I am having Carol KarvonenGail I. Mata maintain her fluticasone, Vitamin D3, levothyroxine, alendronate, and zolpidem.  No orders of the defined types were placed in this encounter.    Follow-up: No Follow-up on file.  Sonda PrimesAlex Plotnikov, MD

## 2017-09-29 ENCOUNTER — Other Ambulatory Visit: Payer: Self-pay | Admitting: Internal Medicine

## 2017-09-29 DIAGNOSIS — E039 Hypothyroidism, unspecified: Secondary | ICD-10-CM

## 2017-09-29 MED ORDER — LEVOTHYROXINE SODIUM 88 MCG PO TABS: 88.0000 ug | ORAL_TABLET | Freq: Every day | ORAL | 3 refills | Status: DC

## 2017-09-29 MED ORDER — CEFUROXIME AXETIL 250 MG PO TABS: 250.0000 mg | ORAL_TABLET | Freq: Two times a day (BID) | ORAL | 0 refills | Status: AC

## 2017-09-30 ENCOUNTER — Telehealth: Payer: Self-pay | Admitting: Internal Medicine

## 2017-09-30 NOTE — Telephone Encounter (Signed)
Sorry - it is 4 days Thx

## 2017-09-30 NOTE — Telephone Encounter (Signed)
Antibiotic clarification sending back to provider

## 2017-09-30 NOTE — Telephone Encounter (Signed)
Please advise about antibiotic.  

## 2017-09-30 NOTE — Telephone Encounter (Signed)
Copied from CRM [phone]. Topic: Quick Communication - See Telephone Encounter >> Sep 30, 2017  9:44 AM Jolayne Hainesaylor, Brittany L wrote: CRM for notification. See Telephone encounter for:  Pt called & stated that she is concerned bc the script for CEFUROXIME 50mg  says Take 1 tablet (250 mg total) by mouth 2 (two) times daily for 14 days. She would like to know if this is correct, also the pharmacist was a little complexed too about it. They were told to issue 8 pills. Please advise. [facility]. She said she does not want to take it for 14 days if she doesn't have too. She wants to know which the dr wanted her to do. Leave detailed message on Voicemail on her cell @ [phone]  09/30/17.

## 2017-10-01 NOTE — Telephone Encounter (Signed)
Pt.notified

## 2017-10-28 ENCOUNTER — Ambulatory Visit (INDEPENDENT_AMBULATORY_CARE_PROVIDER_SITE_OTHER): Payer: Managed Care, Other (non HMO) | Admitting: *Deleted

## 2017-10-28 DIAGNOSIS — Z23 Encounter for immunization: Secondary | ICD-10-CM | POA: Diagnosis not present

## 2017-10-29 ENCOUNTER — Ambulatory Visit (INDEPENDENT_AMBULATORY_CARE_PROVIDER_SITE_OTHER): Payer: Managed Care, Other (non HMO) | Admitting: Gynecology

## 2017-10-29 ENCOUNTER — Encounter: Payer: Self-pay | Admitting: Gynecology

## 2017-10-29 VITALS — BP 112/76 | Ht 67.0 in | Wt 119.0 lb

## 2017-10-29 DIAGNOSIS — M810 Age-related osteoporosis without current pathological fracture: Secondary | ICD-10-CM | POA: Diagnosis not present

## 2017-10-29 DIAGNOSIS — N952 Postmenopausal atrophic vaginitis: Secondary | ICD-10-CM | POA: Diagnosis not present

## 2017-10-29 DIAGNOSIS — Z01411 Encounter for gynecological examination (general) (routine) with abnormal findings: Secondary | ICD-10-CM | POA: Diagnosis not present

## 2017-10-29 MED ORDER — ALENDRONATE SODIUM 70 MG PO TABS: ORAL_TABLET | ORAL | 4 refills | Status: DC

## 2017-10-29 NOTE — Patient Instructions (Signed)
Follow-up in 1 year for annual exam, sooner as needed. 

## 2017-10-29 NOTE — Progress Notes (Addendum)
    Carol Mata [birth year] [id number]        [age] y.o.  [id number] for annual gynecologic exam.  Doing well without gynecologic complaints  Past medical history,surgical history, problem list, medications, allergies, family history and social history were all reviewed and documented as reviewed in the EPIC chart.  ROS:  Performed with pertinent positives and negatives included in the history, assessment and plan.   Additional significant findings : None   Exam: Kennon PortelaKim [facility] assistant Vitals:   10/29/17 0840  BP: 112/76  Weight: 119 lb (54 kg)  Height: 5\' 7"  (1.702 m)   Body mass index is 18.64 kg/m.  General appearance:  Normal affect, orientation and appearance. Skin: Grossly normal HEENT: Without gross lesions.  No cervical or supraclavicular adenopathy. Thyroid normal.  Lungs:  Clear without wheezing, rales or rhonchi Cardiac: RR, without RMG Abdominal:  Soft, nontender, without masses, guarding, rebound, organomegaly or hernia Breasts:  Examined lying and sitting without masses, retractions, discharge or axillary adenopathy. Pelvic:  Ext, BUS, Vagina: With atrophic changes  Cervix: With atrophic changes  Uterus: Anteverted, normal size, shape and contour, midline and mobile nontender   Adnexa: Without masses or tenderness    Anus and perineum: Normal   Rectovaginal: Normal sphincter tone without palpated masses or tenderness.    Assessment/Plan:  62 y.o. [id number] female for annual gynecologic exam.   1. Postmenopausal/atrophic genital changes.  No significant hot flushes, night sweats, vaginal dryness or any vaginal bleeding.  Continue to monitor and report any issues or bleeding. 2. Osteoporosis.  DEXA 12/2016 T score -1.8 DEXA 2014 T score -2.5.  On alendronate times 4 years.  Doing well without side effects.  Plan to continue this year with follow-up DEXA next year at 2-year interval.  At that point we will plan discontinuing alendronate at a 5-year course.  Refill times  1 year provided. 3. Insomnia.  Uses Ambien sparingly.  Is concerned about long-term effects and trying to not use it at all.  Has reviewed insomnia with her primary physician and have tried different things to know benefit.  Has Ambien supply at home but will call if she needs more that she primarily uses with travel. 4. Mammography 06/2016.  Schedule mammography now.  Breast exam normal today. 5. Colonoscopy 2015.  Repeat at their recommended interval. 6. Pap smear 08/2015.  No Pap smear done today.  No history of abnormal Pap smears previously.  Plan Pap smear next year at 3-year interval per current screening guidelines. 7. Health maintenance.  Recently had routine lab work done through her primary physician's office.  Was treated for UTI but only took 2 days of antibiotics.  Was asymptomatic before treatment.  Check urinalysis today.  Follow-up in 1 year for annual exam.   Dara Lordsimothy P Raffael Bugarin MD, 9:02 AM 10/29/2017

## 2017-10-31 LAB — URINALYSIS W MICROSCOPIC + REFLEX CULTURE
BILIRUBIN URINE: NEGATIVE
Bacteria, UA: NONE SEEN /HPF
GLUCOSE, UA: NEGATIVE
Hyaline Cast: NONE SEEN /LPF
KETONES UR: NEGATIVE
NITRITES URINE, INITIAL: NEGATIVE
PROTEIN: NEGATIVE
SQUAMOUS EPITHELIAL / LPF: NONE SEEN /HPF (ref <=5)
Specific Gravity, Urine: 1.012 (ref 1.001–1.03)
WBC UA: NONE SEEN /HPF (ref 0–5)
pH: 7.5 (ref 5.0–8.0)

## 2017-10-31 LAB — URINE CULTURE
MICRO NUMBER:: 90014442
RESULT: NO GROWTH
SPECIMEN QUALITY: ADEQUATE

## 2017-10-31 LAB — CULTURE INDICATED

## 2017-11-02 ENCOUNTER — Encounter: Payer: Self-pay | Admitting: Gynecology

## 2017-11-05 ENCOUNTER — Encounter: Payer: Self-pay | Admitting: Gynecology

## 2017-11-05 NOTE — Telephone Encounter (Signed)
Referring to RBC's in urine.

## 2017-11-05 NOTE — Telephone Encounter (Signed)
Based upon the low number and the previous negative urine analysis for blood the month before did not feel it was necessary to repeat the specimen.  If the patient would feel more comfortable that would be okay to recheck a clean-catch urine analysis.  Not uncommon to see low level blood due to fragile changes with aging in the urinary system.

## 2017-12-23 ENCOUNTER — Ambulatory Visit (INDEPENDENT_AMBULATORY_CARE_PROVIDER_SITE_OTHER): Payer: Managed Care, Other (non HMO)

## 2017-12-23 DIAGNOSIS — Z299 Encounter for prophylactic measures, unspecified: Secondary | ICD-10-CM | POA: Diagnosis not present

## 2018-05-26 ENCOUNTER — Ambulatory Visit: Payer: Managed Care, Other (non HMO) | Admitting: Internal Medicine

## 2018-05-26 ENCOUNTER — Encounter: Payer: Self-pay | Admitting: Internal Medicine

## 2018-05-26 VITALS — BP 120/70 | HR 88 | Temp 99.2°F | Ht 67.0 in | Wt 118.0 lb

## 2018-05-26 DIAGNOSIS — J209 Acute bronchitis, unspecified: Secondary | ICD-10-CM | POA: Insufficient documentation

## 2018-05-26 MED ORDER — DOXYCYCLINE HYCLATE 100 MG PO TABS: 100.0000 mg | ORAL_TABLET | Freq: Two times a day (BID) | ORAL | 0 refills | Status: DC

## 2018-05-26 NOTE — Patient Instructions (Addendum)
Take the antibiotic as prescribed.  Continue the over the counter cold medications as needed.    Increase your rest and fluids.    Call if no improvement     Acute Bronchitis, Adult Acute bronchitis is sudden (acute) swelling of the air tubes (bronchi) in the lungs. Acute bronchitis causes these tubes to fill with mucus, which can make it hard to breathe. It can also cause coughing or wheezing. In adults, acute bronchitis usually goes away within 2 weeks. A cough caused by bronchitis may last up to 3 weeks. Smoking, allergies, and asthma can make the condition worse. Repeated episodes of bronchitis may cause further lung problems, such as chronic obstructive pulmonary disease (COPD). What are the causes? This condition can be caused by germs and by substances that irritate the lungs, including:  Cold and flu viruses. This condition is most often caused by the same virus that causes a cold.  Bacteria.  Exposure to tobacco smoke, dust, fumes, and air pollution.  What increases the risk? This condition is more likely to develop in people who:  Have close contact with someone with acute bronchitis.  Are exposed to lung irritants, such as tobacco smoke, dust, fumes, and vapors.  Have a weak immune system.  Have a respiratory condition such as asthma.  What are the signs or symptoms? Symptoms of this condition include:  A cough.  Coughing up clear, yellow, or green mucus.  Wheezing.  Chest congestion.  Shortness of breath.  A fever.  Body aches.  Chills.  A sore throat.  How is this diagnosed? This condition is usually diagnosed with a physical exam. During the exam, your health care provider may order tests, such as chest X-rays, to rule out other conditions. He or she may also:  Test a sample of your mucus for bacterial infection.  Check the level of oxygen in your blood. This is done to check for pneumonia.  Do a chest X-ray or lung function testing to rule  out pneumonia and other conditions.  Perform blood tests.  Your health care provider will also ask about your symptoms and medical history. How is this treated? Most cases of acute bronchitis clear up over time without treatment. Your health care provider may recommend:  Drinking more fluids. Drinking more makes your mucus thinner, which may make it easier to breathe.  Taking a medicine for a fever or cough.  Taking an antibiotic medicine.  Using an inhaler to help improve shortness of breath and to control a cough.  Using a cool mist vaporizer or humidifier to make it easier to breathe.  Follow these instructions at home: Medicines  Take over-the-counter and prescription medicines only as told by your health care provider.  If you were prescribed an antibiotic, take it as told by your health care provider. Do not stop taking the antibiotic even if you start to feel better. General instructions  Get plenty of rest.  Drink enough fluids to keep your urine clear or pale yellow.  Avoid smoking and secondhand smoke. Exposure to cigarette smoke or irritating chemicals will make bronchitis worse. If you smoke and you need help quitting, ask your health care provider. Quitting smoking will help your lungs heal faster.  Use an inhaler, cool mist vaporizer, or humidifier as told by your health care provider.  Keep all follow-up visits as told by your health care provider. This is important. How is this prevented? To lower your risk of getting this condition again:  [employer] your  hands often with soap and water. If soap and water are not available, use hand sanitizer.  Avoid contact with people who have cold symptoms.  Try not to touch your hands to your mouth, nose, or eyes.  Make sure to get the flu shot every year.  Contact a health care provider if:  Your symptoms do not improve in 2 weeks of treatment. Get help right away if:  You cough up blood.  You have chest  pain.  You have severe shortness of breath.  You become dehydrated.  You faint or keep feeling like you are going to faint.  You keep vomiting.  You have a severe headache.  Your fever or chills gets worse. This information is not intended to replace advice given to you by your health care provider. Make sure you discuss any questions you have with your health care provider. Document Released: 11/20/2004 Document Revised: 05/07/2016 Document Reviewed: 04/02/2016 Elsevier Interactive Patient Education  [employer]2018 [employer]

## 2018-05-26 NOTE — Assessment & Plan Note (Signed)
Likely bacterial Take the doxycycline as prescribed  otc cold medications as needed Rest, fluids Call if no improvement

## 2018-05-26 NOTE — Progress Notes (Signed)
Subjective:    Patient ID: Carol Mata, female    DOB: 10-04-1956, 62 y.o.   MRN: [id number]  HPI She is here for an acute visit for cold symptoms.  Her symptoms started several days ago  She is experiencing severe fatigue, productive cough, fever - initially low gade and has been subjectively higher at times, sinus pressure, headaches, dizziness and body aches.  This monring she was coughing up thick green sputum.    She denies nasal congestion, ear pain, sore throat, sob, wheeze and GI symptoms.   She has taken tylenol and nyquil  Medications and allergies reviewed with patient and updated if appropriate.  Patient Active Problem List   Diagnosis Date Noted  . Well adult exam 09/28/2017  . IBS (irritable bowel syndrome) 09/26/2016  . Lactose intolerance 09/26/2016  . Osteoporosis 09/26/2016  . Vertigo   . Atrophic vaginitis   . Thyroid disease     Current Outpatient Medications on File Prior to Visit  Medication Sig Dispense Refill  . alendronate (FOSAMAX) 70 MG tablet TAKE 1 TABLET BY MOUTH EVERY 7 DAYS. TAKE WITH A FULL GLASS OF WATER ON AN EMPTY STOMACH. 12 tablet 4  . Cholecalciferol (VITAMIN D3) 2000 units capsule Take 1 capsule (2,000 Units total) by mouth daily. 100 capsule 3  . fluticasone (FLONASE) 50 MCG/ACT nasal spray Place 2 sprays into the nose as needed. 16 g 4  . levothyroxine (SYNTHROID, LEVOTHROID) 88 MCG tablet Take 1 tablet (88 mcg total) by mouth daily. 90 tablet 3  . zolpidem (AMBIEN) 5 MG tablet TAKE ONE TABLET AT BEDTIME AS NEEDED FOR SLEEP 30 tablet 3   No current facility-administered medications on file prior to visit.     Past Medical History:  Diagnosis Date  . ASCUS (atypical squamous cells of undetermined significance) on Pap smear   . Lactose intolerance   . Osteoporosis 12/2016   T score -1.8 2014 T score -2.5. 2016 T score -2.2 statistically significant improvement AP spine and left hip, right hip stable  . Sinusitis   .  Thyroid disease    Hypothyroid  . Vertigo     Past Surgical History:  Procedure Laterality Date  . BUNIONECTOMY    . DILATION AND CURETTAGE OF UTERUS    . HYSTEROSCOPY  [id number]  . NASAL SINUS SURGERY    . PELVIC LAPAROSCOPY     DL-Rt salpingectomy    Social History   Socioeconomic History  . Marital status: Married    Spouse name: Not on file  . Number of children: Not on file  . Years of education: Not on file  . Highest education level: Not on file  Occupational History  . Not on file  Social Needs  . Financial resource strain: Not on file  . Food insecurity:    Worry: Not on file    Inability: Not on file  . Transportation needs:    Medical: Not on file    Non-medical: Not on file  Tobacco Use  . Smoking status: Former Games developermoker  . Smokeless tobacco: Never Used  Substance and Sexual Activity  . Alcohol use: Yes    Alcohol/week: 4.2 oz    Types: 7 Standard drinks or equivalent per week  . Drug use: No  . Sexual activity: Yes    Birth control/protection: Post-menopausal    Comment: Vasectomy-1st intercourse 62 yo-Fewer than 5 partners  Lifestyle  . Physical activity:    Days per week: Not on file  Minutes per session: Not on file  . Stress: Not on file  Relationships  . Social connections:    Talks on phone: Not on file    Gets together: Not on file    Attends religious service: Not on file    Active member of club or organization: Not on file    Attends meetings of clubs or organizations: Not on file    Relationship status: Not on file  Other Topics Concern  . Not on file  Social History Narrative  . Not on file    Family History  Problem Relation Age of Onset  . Hypertension Father   . Heart disease Father   . Cancer Father        Prostate  . Cancer Maternal Aunt        Colon cancer  . Thyroid disease Sister     Review of Systems  Constitutional: Positive for fatigue and fever. Negative for chills.  HENT: Positive for sinus pressure.  Negative for congestion, ear pain and sore throat.   Respiratory: Positive for cough (green sputum). Negative for shortness of breath and wheezing.   Gastrointestinal: Negative for abdominal pain and nausea.  Musculoskeletal: Positive for myalgias.  Neurological: Positive for dizziness and headaches (little). Negative for light-headedness.       Objective:   Vitals:   05/26/18 0929  BP: 120/70  Pulse: 88  Temp: 99.2 F (37.3 C)  SpO2: 98%   Filed Weights   05/26/18 0929  Weight: 118 lb (53.5 kg)   Body mass index is 18.48 kg/m.  Wt Readings from Last 3 Encounters:  05/26/18 118 lb (53.5 kg)  10/29/17 119 lb (54 kg)  09/28/17 118 lb (53.5 kg)     Physical Exam GENERAL APPEARANCE: Appears stated age, well appearing, NAD EYES: conjunctiva clear, no icterus HEENT: bilateral tympanic membranes and ear canals normal, oropharynx with no erythema, no thyromegaly, trachea midline, no cervical or supraclavicular lymphadenopathy LUNGS: Clear to auscultation without wheeze or crackles, unlabored breathing, good air entry bilaterally CARDIOVASCULAR: Normal S1,S2 without murmurs, no edema SKIN: warm, dry        Assessment & Plan:   See Problem List for Assessment and Plan of chronic medical problems.

## 2018-06-09 ENCOUNTER — Other Ambulatory Visit: Payer: Self-pay | Admitting: Gynecology

## 2018-06-09 DIAGNOSIS — Z1231 Encounter for screening mammogram for malignant neoplasm of breast: Secondary | ICD-10-CM

## 2018-07-09 ENCOUNTER — Ambulatory Visit
Admission: RE | Admit: 2018-07-09 | Discharge: 2018-07-09 | Disposition: A | Discharge status: Home / Self Care | Payer: Managed Care, Other (non HMO) | Source: Ambulatory Visit | Attending: Gynecology | Admitting: Gynecology

## 2018-07-09 DIAGNOSIS — Z1231 Encounter for screening mammogram for malignant neoplasm of breast: Secondary | ICD-10-CM

## 2018-08-18 NOTE — Progress Notes (Signed)
Subjective:    Patient ID: Carol Mata, female    DOB: [birth year], 62 y.o.   MRN: [id number]  HPI She is here for an acute visit for cold symptoms.  Her symptoms started two nights ago  She is experiencing sneezing, gum pain, coughing, subjective fevers yesterday, nasal congestion with thick green mucus, ear pain and pressure, postnasal drip, sinus pain, sore throat, dry cough, body aches and mild lightheadedness.  She denies any shortness of breath wheezing, nausea and headaches.  She does have a history of sinus infections, but has not had them in a while.  She has taken nyquil  Medications and allergies reviewed with patient and updated if appropriate.  Patient Active Problem List   Diagnosis Date Noted  . Acute sinus infection 08/19/2018  . Well adult exam 09/28/2017  . IBS (irritable bowel syndrome) 09/26/2016  . Lactose intolerance 09/26/2016  . Osteoporosis 09/26/2016  . Vertigo   . Atrophic vaginitis   . Thyroid disease     Current Outpatient Medications on File Prior to Visit  Medication Sig Dispense Refill  . alendronate (FOSAMAX) 70 MG tablet TAKE 1 TABLET BY MOUTH EVERY 7 DAYS. TAKE WITH A FULL GLASS OF WATER ON AN EMPTY STOMACH. 12 tablet 4  . Cholecalciferol (VITAMIN D3) 2000 units capsule Take 1 capsule (2,000 Units total) by mouth daily. 100 capsule 3  . fluticasone (FLONASE) 50 MCG/ACT nasal spray Place 2 sprays into the nose as needed. 16 g 4  . levothyroxine (SYNTHROID, LEVOTHROID) 88 MCG tablet Take 1 tablet (88 mcg total) by mouth daily. 90 tablet 3  . zolpidem (AMBIEN) 5 MG tablet TAKE ONE TABLET AT BEDTIME AS NEEDED FOR SLEEP 30 tablet 3   No current facility-administered medications on file prior to visit.     Past Medical History:  Diagnosis Date  . ASCUS (atypical squamous cells of undetermined significance) on Pap smear   . Lactose intolerance   . Osteoporosis 12/2016   T score -1.8 2014 T score -2.5. 2016 T score -2.2 statistically  significant improvement AP spine and left hip, right hip stable  . Sinusitis   . Thyroid disease    Hypothyroid  . Vertigo     Past Surgical History:  Procedure Laterality Date  . BUNIONECTOMY    . DILATION AND CURETTAGE OF UTERUS    . HYSTEROSCOPY  [id number]  . NASAL SINUS SURGERY    . PELVIC LAPAROSCOPY     DL-Rt salpingectomy    Social History   Socioeconomic History  . Marital status: Married    Spouse name: Not on file  . Number of children: Not on file  . Years of education: Not on file  . Highest education level: Not on file  Occupational History  . Not on file  Social Needs  . Financial resource strain: Not on file  . Food insecurity:    Worry: Not on file    Inability: Not on file  . Transportation needs:    Medical: Not on file    Non-medical: Not on file  Tobacco Use  . Smoking status: Former Games developer  . Smokeless tobacco: Never Used  Substance and Sexual Activity  . Alcohol use: Yes    Alcohol/week: 7.0 standard drinks    Types: 7 Standard drinks or equivalent per week  . Drug use: No  . Sexual activity: Yes    Birth control/protection: Post-menopausal    Comment: Vasectomy-1st intercourse 62 yo-Fewer than 5 partners  Lifestyle  .  Physical activity:    Days per week: Not on file    Minutes per session: Not on file  . Stress: Not on file  Relationships  . Social connections:    Talks on phone: Not on file    Gets together: Not on file    Attends religious service: Not on file    Active member of club or organization: Not on file    Attends meetings of clubs or organizations: Not on file    Relationship status: Not on file  Other Topics Concern  . Not on file  Social History Narrative  . Not on file    Family History  Problem Relation Age of Onset  . Hypertension Father   . Heart disease Father   . Cancer Father        Prostate  . Cancer Maternal Aunt        Colon cancer  . Thyroid disease Sister     Review of Systems    Constitutional: Positive for fever (subjective).  HENT: Positive for congestion, ear pain (pain and pressure), postnasal drip, sinus pain (and gum pain) and sore throat.   Respiratory: Positive for cough. Negative for shortness of breath and wheezing. Choking: dry.   Gastrointestinal: Negative for diarrhea and nausea.  Musculoskeletal: Positive for myalgias.  Neurological: Positive for light-headedness. Negative for dizziness and headaches.       Objective:   Vitals:   08/19/18 0946  BP: 122/78  Pulse: 79  Resp: 16  Temp: 99.3 F (37.4 C)  SpO2: 99%   Filed Weights   08/19/18 0946  Weight: 119 lb (54 kg)   Body mass index is 18.64 kg/m.  Wt Readings from Last 3 Encounters:  08/19/18 119 lb (54 kg)  05/26/18 118 lb (53.5 kg)  10/29/17 119 lb (54 kg)     Physical Exam GENERAL APPEARANCE: Appears stated age, well appearing, NAD EYES: conjunctiva clear, no icterus HEENT: bilateral tympanic membranes and ear canals normal, oropharynx with no erythema, no thyromegaly, trachea midline, no cervical or supraclavicular lymphadenopathy LUNGS: Clear to auscultation without wheeze or crackles, unlabored breathing, good air entry bilaterally CARDIOVASCULAR: Normal S1,S2 without murmurs, no edema SKIN: warm, dry        Assessment & Plan:   See Problem List for Assessment and Plan of chronic medical problems.

## 2018-08-19 ENCOUNTER — Encounter: Payer: Self-pay | Admitting: Internal Medicine

## 2018-08-19 ENCOUNTER — Ambulatory Visit: Payer: Managed Care, Other (non HMO) | Admitting: Internal Medicine

## 2018-08-19 ENCOUNTER — Encounter

## 2018-08-19 DIAGNOSIS — J0100 Acute maxillary sinusitis, unspecified: Secondary | ICD-10-CM | POA: Diagnosis not present

## 2018-08-19 DIAGNOSIS — J019 Acute sinusitis, unspecified: Secondary | ICD-10-CM | POA: Insufficient documentation

## 2018-08-19 MED ORDER — DOXYCYCLINE HYCLATE 100 MG PO TABS: 100.0000 mg | ORAL_TABLET | Freq: Two times a day (BID) | ORAL | 0 refills | Status: DC

## 2018-08-19 NOTE — Patient Instructions (Addendum)
Take the antibiotic if needed if your symptoms do not improve.  Take over the counter cold medications as needed.  Increase rest and fluids.     Call if no improvement       Sinusitis, Adult Sinusitis is soreness and inflammation of your sinuses. Sinuses are hollow spaces in the bones around your face. Your sinuses are located:  Around your eyes.  In the middle of your forehead.  Behind your nose.  In your cheekbones.  Your sinuses and nasal passages are lined with a stringy fluid (mucus). Mucus normally drains out of your sinuses. When your nasal tissues become inflamed or swollen, the mucus can become trapped or blocked so air cannot flow through your sinuses. This allows bacteria, viruses, and funguses to grow, which leads to infection. Sinusitis can develop quickly and last for 7?10 days (acute) or for more than 12 weeks (chronic). Sinusitis often develops after a cold. What are the causes? This condition is caused by anything that creates swelling in the sinuses or stops mucus from draining, including:  Allergies.  Asthma.  Bacterial or viral infection.  Abnormally shaped bones between the nasal passages.  Nasal growths that contain mucus (nasal polyps).  Narrow sinus openings.  Pollutants, such as chemicals or irritants in the air.  A foreign object stuck in the nose.  A fungal infection. This is rare.  What increases the risk? The following factors may make you more likely to develop this condition:  Having allergies or asthma.  Having had a recent cold or respiratory tract infection.  Having structural deformities or blockages in your nose or sinuses.  Having a weak immune system.  Doing a lot of swimming or diving.  Overusing nasal sprays.  Smoking.  What are the signs or symptoms? The main symptoms of this condition are pain and a feeling of pressure around the affected sinuses. Other symptoms include:  Upper  toothache.  Earache.  Headache.  Bad breath.  Decreased sense of smell and taste.  A cough that may get worse at night.  Fatigue.  Fever.  Thick drainage from your nose. The drainage is often green and it may contain pus (purulent).  Stuffy nose or congestion.  Postnasal drip. This is when extra mucus collects in the throat or back of the nose.  Swelling and warmth over the affected sinuses.  Sore throat.  Sensitivity to light.  How is this diagnosed? This condition is diagnosed based on symptoms, a medical history, and a physical exam. To find out if your condition is acute or chronic, your health care provider may:  Look in your nose for signs of nasal polyps.  Tap over the affected sinus to check for signs of infection.  View the inside of your sinuses using an imaging device that has a light attached (endoscope).  If your health care provider suspects that you have chronic sinusitis, you may also:  Be tested for allergies.  Have a sample of mucus taken from your nose (nasal culture) and checked for bacteria.  Have a mucus sample examined to see if your sinusitis is related to an allergy.  If your sinusitis does not respond to treatment and it lasts longer than 8 weeks, you may have an MRI or CT scan to check your sinuses. These scans also help to determine how severe your infection is. In rare cases, a bone biopsy may be done to rule out more serious types of fungal sinus disease. How is this treated? Treatment for sinusitis  depends on the cause and whether your condition is chronic or acute. If a virus is causing your sinusitis, your symptoms will go away on their own within 10 days. You may be given medicines to relieve your symptoms, including:  Topical nasal decongestants. They shrink swollen nasal passages and let mucus drain from your sinuses.  Antihistamines. These drugs block inflammation that is triggered by allergies. This can help to ease swelling in  your nose and sinuses.  Topical nasal corticosteroids. These are nasal sprays that ease inflammation and swelling in your nose and sinuses.  Nasal saline washes. These rinses can help to get rid of thick mucus in your nose.  If your condition is caused by bacteria, you will be given an antibiotic medicine. If your condition is caused by a fungus, you will be given an antifungal medicine. Surgery may be needed to correct underlying conditions, such as narrow nasal passages. Surgery may also be needed to remove polyps. Follow these instructions at home: Medicines  Take, use, or apply over-the-counter and prescription medicines only as told by your health care provider. These may include nasal sprays.  If you were prescribed an antibiotic medicine, take it as told by your health care provider. Do not stop taking the antibiotic even if you start to feel better. Hydrate and Humidify  Drink enough water to keep your urine clear or pale yellow. Staying hydrated will help to thin your mucus.  Use a cool mist humidifier to keep the humidity level in your home above 50%.  Inhale steam for 10-15 minutes, 3-4 times a day or as told by your health care provider. You can do this in the bathroom while a hot shower is running.  Limit your exposure to cool or dry air. Rest  Rest as much as possible.  Sleep with your head raised (elevated).  Make sure to get enough sleep each night. General instructions  Apply a warm, moist washcloth to your face 3-4 times a day or as told by your health care provider. This will help with discomfort.  Wash your hands often with soap and water to reduce your exposure to viruses and other germs. If soap and water are not available, use hand sanitizer.  Do not smoke. Avoid being around people who are smoking (secondhand smoke).  Keep all follow-up visits as told by your health care provider. This is important. Contact a health care provider if:  You have a  fever.  Your symptoms get worse.  Your symptoms do not improve within 10 days. Get help right away if:  You have a severe headache.  You have persistent vomiting.  You have pain or swelling around your face or eyes.  You have vision problems.  You develop confusion.  Your neck is stiff.  You have trouble breathing. This information is not intended to replace advice given to you by your health care provider. Make sure you discuss any questions you have with your health care provider. Document Released: 10/13/2005 Document Revised: 06/08/2016 Document Reviewed: 08/08/2015 Elsevier Interactive Patient Education  [employer].

## 2018-08-19 NOTE — Assessment & Plan Note (Signed)
Symptoms consistent with an acute sinus infection May be viral in nature We both agree avoid an antibiotic would be best She will continue NyQuil Other over-the-counter cold medications as needed-discussed Mucinex, saline nasal spray, Nettie pot Increase rest and fluids I will give her a prescription for doxycycline-she will only take this if her symptoms do not improve Call if no improvement/questions

## 2018-10-28 ENCOUNTER — Ambulatory Visit: Payer: Managed Care, Other (non HMO) | Admitting: Gynecology

## 2018-10-28 ENCOUNTER — Encounter: Payer: Self-pay | Admitting: Gynecology

## 2018-10-28 VITALS — BP 116/72 | Ht 67.5 in | Wt 122.0 lb

## 2018-10-28 DIAGNOSIS — M810 Age-related osteoporosis without current pathological fracture: Secondary | ICD-10-CM | POA: Diagnosis not present

## 2018-10-28 DIAGNOSIS — N952 Postmenopausal atrophic vaginitis: Secondary | ICD-10-CM | POA: Diagnosis not present

## 2018-10-28 DIAGNOSIS — Z13220 Encounter for screening for lipoid disorders: Secondary | ICD-10-CM

## 2018-10-28 DIAGNOSIS — Z01419 Encounter for gynecological examination (general) (routine) without abnormal findings: Secondary | ICD-10-CM

## 2018-10-28 MED ORDER — ZOLPIDEM TARTRATE 5 MG PO TABS: ORAL_TABLET | ORAL | 3 refills | Status: DC

## 2018-10-28 MED ORDER — ALENDRONATE SODIUM 70 MG PO TABS: ORAL_TABLET | ORAL | 4 refills | Status: DC

## 2018-10-28 NOTE — Progress Notes (Signed)
    ADE DEITERING 11-11-1955 [id number]        63 y.o.  [id number] for annual gynecologic exam.  Without gynecologic complaints.  Several issues noted below.  Past medical history,surgical history, problem list, medications, allergies, family history and social history were all reviewed and documented as reviewed in the EPIC chart.  ROS:  Performed with pertinent positives and negatives included in the history, assessment and plan.   Additional significant findings : None   Exam: [name] assistant Vitals:   10/28/18 0910  BP: 116/72  Weight: 122 lb (55.3 kg)  Height: 5' 7.5" (1.715 m)   Body mass index is 18.83 kg/m.  General appearance:  Normal affect, orientation and appearance. Skin: Grossly normal HEENT: Without gross lesions.  No cervical or supraclavicular adenopathy. Thyroid normal.  Lungs:  Clear without wheezing, rales or rhonchi Cardiac: RR, without RMG Abdominal:  Soft, nontender, without masses, guarding, rebound, organomegaly or hernia Breasts:  Examined lying and sitting without masses, retractions, discharge or axillary adenopathy. Pelvic:  Ext, BUS, Vagina: With atrophic changes  Cervix: With atrophic changes.  Pap smear done  Uterus: Anteverted, normal size, shape and contour, midline and mobile nontender   Adnexa: Without masses or tenderness    Anus and perineum: Normal   Rectovaginal: Normal sphincter tone without palpated masses or tenderness.    Assessment/Plan:  63 y.o. [id number] female for annual gynecologic exam..   1. Postmenopausal.  No significant menopausal symptoms or any vaginal bleeding. 2. Osteoporosis.  DEXA 2018 T score -1.8.  On alendronate for 5 years.  Check baseline DEXA this coming spring when due.  Continue on alendronate for now.  We again discussed the risks versus benefits to include osteonecrosis of the jaw and atypical fractures.  When to stop for drug-free holiday also discussed.  Will readdress after her next DEXA.  Refill for  alendronate x1 year provided.  Check vitamin D level and TSH today 3. Insomnia.  Uses Ambien occasionally without side effects.  #30 with 3 refills provided. 4. Mammography 06/2018.  Continue with annual mammography when due.  Breast exam normal today. 5. Colonoscopy 2015.  Repeat at their recommended interval. 6. Pap smear 2016.  Pap smear done today.  No history of abnormal Pap smears previously. 7. Health maintenance.  Requests baseline labs to have available for her primary physician appointment.  CBC, CMP, lipid profile, vitamin D and TSH ordered.  Follow-up for bone density as scheduled otherwise 1 year for annual exam, sooner as needed   Dara Lords MD, 10:06 AM 10/28/2018

## 2018-10-28 NOTE — Patient Instructions (Signed)
Follow-up for the bone density as scheduled. 

## 2018-10-28 NOTE — Addendum Note (Signed)
Addended by: Dayna Barker on: 10/28/2018 10:00 AM   Modules accepted: Orders

## 2018-10-29 ENCOUNTER — Other Ambulatory Visit: Payer: Self-pay | Admitting: Gynecology

## 2018-10-29 ENCOUNTER — Encounter: Payer: Self-pay | Admitting: Gynecology

## 2018-10-29 DIAGNOSIS — E559 Vitamin D deficiency, unspecified: Secondary | ICD-10-CM

## 2018-10-29 LAB — CBC WITH DIFFERENTIAL/PLATELET
ABSOLUTE MONOCYTES: 329 {cells}/uL (ref 200–950)
BASOS PCT: 0.8 %
Basophils Absolute: 30 cells/uL (ref 0–200)
Eosinophils Absolute: 59 cells/uL (ref 15–500)
Eosinophils Relative: 1.6 %
HEMATOCRIT: 37.2 % (ref 35.0–45.0)
HEMOGLOBIN: 12.6 g/dL (ref 11.7–15.5)
LYMPHS ABS: 1210 {cells}/uL (ref 850–3900)
MCH: 29.7 pg (ref 27.0–33.0)
MCHC: 33.9 g/dL (ref 32.0–36.0)
MCV: 87.7 fL (ref 80.0–100.0)
MPV: 9.5 fL (ref 7.5–12.5)
Monocytes Relative: 8.9 %
NEUTROS ABS: 2072 {cells}/uL (ref 1500–7800)
Neutrophils Relative %: 56 %
Platelets: 188 10*3/uL (ref 140–400)
RBC: 4.24 10*6/uL (ref 3.80–5.10)
RDW: 12.4 % (ref 11.0–15.0)
Total Lymphocyte: 32.7 %
WBC: 3.7 10*3/uL — ABNORMAL LOW (ref 3.8–10.8)

## 2018-10-29 LAB — COMPREHENSIVE METABOLIC PANEL
AG Ratio: 2.1 (calc) (ref 1.0–2.5)
ALBUMIN MSPROF: 4.4 g/dL (ref 3.6–5.1)
ALT: 12 U/L (ref 6–29)
AST: 21 U/L (ref 10–35)
Alkaline phosphatase (APISO): 51 U/L (ref 33–130)
BUN: 11 mg/dL (ref 7–25)
CHLORIDE: 102 mmol/L (ref 98–110)
CO2: 29 mmol/L (ref 20–32)
CREATININE: 0.71 mg/dL (ref 0.50–0.99)
Calcium: 9.2 mg/dL (ref 8.6–10.4)
GLOBULIN: 2.1 g/dL (ref 1.9–3.7)
GLUCOSE: 69 mg/dL (ref 65–99)
POTASSIUM: 4.3 mmol/L (ref 3.5–5.3)
SODIUM: 138 mmol/L (ref 135–146)
TOTAL PROTEIN: 6.5 g/dL (ref 6.1–8.1)
Total Bilirubin: 0.7 mg/dL (ref 0.2–1.2)

## 2018-10-29 LAB — PAP IG W/ RFLX HPV ASCU

## 2018-10-29 LAB — LIPID PANEL
CHOL/HDL RATIO: 2.4 (calc) (ref <5.0)
Cholesterol: 198 mg/dL (ref <200)
HDL: 81 mg/dL (ref >50)
LDL CHOLESTEROL (CALC): 102 mg/dL — AB
NON-HDL CHOLESTEROL (CALC): 117 mg/dL (ref <130)
Triglycerides: 67 mg/dL (ref <150)

## 2018-10-29 LAB — VITAMIN D 25 HYDROXY (VIT D DEFICIENCY, FRACTURES): Vit D, 25-Hydroxy: 24 ng/mL — ABNORMAL LOW (ref 30–100)

## 2018-10-29 LAB — TSH: TSH: 4.01 mIU/L (ref 0.40–4.50)

## 2018-10-29 MED ORDER — VITAMIN D (ERGOCALCIFEROL) 1.25 MG (50000 UNIT) PO CAPS: 50000.0000 [IU] | ORAL_CAPSULE | ORAL | 0 refills | Status: DC

## 2018-10-29 NOTE — Progress Notes (Signed)
Per patient MyChart message

## 2018-11-04 ENCOUNTER — Encounter: Payer: Self-pay | Admitting: Gynecology

## 2018-11-04 NOTE — Telephone Encounter (Signed)
They will be posted.  I am not sure how long it takes for it to go through the system but I would suspect that should be up by now

## 2018-11-07 ENCOUNTER — Encounter: Payer: Self-pay | Admitting: Gynecology

## 2018-11-08 NOTE — Telephone Encounter (Signed)
I think everything will be fine.  I would just go back to taking 1 tablet weekly and complete what she has left.  We will then check a vitamin D level.

## 2018-12-08 ENCOUNTER — Encounter: Payer: Self-pay | Admitting: Family

## 2018-12-08 ENCOUNTER — Ambulatory Visit: Payer: Managed Care, Other (non HMO) | Admitting: Family

## 2018-12-08 VITALS — BP 112/76 | HR 92 | Temp 99.1°F | Ht 67.5 in

## 2018-12-08 DIAGNOSIS — K529 Noninfective gastroenteritis and colitis, unspecified: Secondary | ICD-10-CM | POA: Diagnosis not present

## 2018-12-08 NOTE — Progress Notes (Signed)
Carol Mata is a 63 y.o. female with the following history as recorded in [facility]:  Patient Active Problem List   Diagnosis Date Noted  . Acute sinus infection 08/19/2018  . Well adult exam 09/28/2017  . IBS (irritable bowel syndrome) 09/26/2016  . Lactose intolerance 09/26/2016  . Osteoporosis 09/26/2016  . Vertigo   . Atrophic vaginitis   . Thyroid disease     Current Outpatient Medications  Medication Sig Dispense Refill  . alendronate (FOSAMAX) 70 MG tablet TAKE 1 TABLET BY MOUTH EVERY 7 DAYS. TAKE WITH A FULL GLASS OF WATER ON AN EMPTY STOMACH. 12 tablet 4  . fluticasone (FLONASE) 50 MCG/ACT nasal spray Place 2 sprays into the nose as needed. 16 g 4  . levothyroxine (SYNTHROID, LEVOTHROID) 88 MCG tablet Take 1 tablet (88 mcg total) by mouth daily. 90 tablet 3  . Vitamin D, Ergocalciferol, (DRISDOL) 1.25 MG (50000 UT) CAPS capsule Take 1 capsule (50,000 Units total) by mouth every 7 (seven) days. 12 capsule 0  . zolpidem (AMBIEN) 5 MG tablet TAKE ONE TABLET AT BEDTIME AS NEEDED FOR SLEEP 30 tablet 3   No current facility-administered medications for this visit.     Allergies: Lactose intolerance (gi)  Past Medical History:  Diagnosis Date  . ASCUS (atypical squamous cells of undetermined significance) on Pap smear   . Lactose intolerance   . Osteoporosis 12/2016   T score -1.8 2014 T score -2.5. 2016 T score -2.2 statistically significant improvement AP spine and left hip, right hip stable  . Sinusitis   . Thyroid disease    Hypothyroid  . Vertigo     Past Surgical History:  Procedure Laterality Date  . BUNIONECTOMY    . DILATION AND CURETTAGE OF UTERUS    . HYSTEROSCOPY  [id number]  . NASAL SINUS SURGERY    . PELVIC LAPAROSCOPY     DL-Rt salpingectomy    Family History  Problem Relation Age of Onset  . Hypertension Father   . Heart disease Father   . Cancer Father        Prostate  . Cancer Maternal Aunt        Colon cancer  . Thyroid disease Sister      Social History   Tobacco Use  . Smoking status: Former Games developer  . Smokeless tobacco: Never Used  Substance Use Topics  . Alcohol use: Yes    Alcohol/week: 7.0 standard drinks    Types: 7 Standard drinks or equivalent per week    Subjective:  Patient presents with 1 day history of nausea, body aches, diarrhea; tried drinking water yesterday- unable to keep anything down; went to see school nurse yesterday- thought to be viral illness; was told by her school nurse that needed be seen if symptoms persisted today; feels that symptoms are improving today- nausea and diarrhea seem better today;     Objective:  Vitals:   12/08/18 1011  BP: 112/76  Pulse: 92  Temp: 99.1 F (37.3 C)  TempSrc: Oral  SpO2: 95%  Height: 5' 7.5" (1.715 m)    General: Well developed, well nourished, in no acute distress; pale appearing Skin : Warm and dry.  Head: Normocephalic and atraumatic  Lungs: Respirations unlabored; clear to auscultation bilaterally without wheeze, rales, rhonchi  CVS exam: normal rate and regular rhythm.  Abdomen: Soft; nontender; nondistended; normoactive bowel sounds; no masses or hepatosplenomegaly  Neurologic: Alert and oriented; speech intact; face symmetrical; moves all extremities well; CNII-XII intact without focal deficit  Assessment:  1. Acute gastroenteritis     Plan:  Suspect viral; reassurance; BRAT diet/ symptomatic treatment discussed; try increasing Gatorade/ Ginger Ale; follow-up worse, no better.   No follow-ups on file.  No orders of the defined types were placed in this encounter.   Requested Prescriptions    No prescriptions requested or ordered in this encounter

## 2018-12-08 NOTE — Patient Instructions (Signed)
Food Choices to Help Relieve Diarrhea, Adult  When you have diarrhea, the foods you eat and your eating habits are very important. Choosing the right foods and drinks can help:   Relieve diarrhea.   Replace lost fluids and nutrients.   Prevent dehydration.  What general guidelines should I follow?    Relieving diarrhea   Choose foods with less than 2 g or .07 oz. of fiber per serving.   Limit fats to less than 8 tsp (38 g or 1.34 oz.) a day.   Avoid the following:  ? Foods and beverages sweetened with high-fructose corn syrup, honey, or sugar alcohols such as xylitol, sorbitol, and mannitol.  ? Foods that contain a lot of fat or sugar.  ? Fried, greasy, or spicy foods.  ? High-fiber grains, breads, and cereals.  ? Raw fruits and vegetables.   Eat foods that are rich in probiotics. These foods include dairy products such as yogurt and fermented milk products. They help increase healthy bacteria in the stomach and intestines (gastrointestinal tract, or GI tract).   If you have lactose intolerance, avoid dairy products. These may make your diarrhea worse.   Take medicine to help stop diarrhea (antidiarrheal medicine) only as told by your health care provider.  Replacing nutrients   Eat small meals or snacks every 3-4 hours.   Eat bland foods, such as white rice, toast, or baked potato, until your diarrhea starts to get better. Gradually reintroduce nutrient-rich foods as tolerated or as told by your health care provider. This includes:  ? Well-cooked protein foods.  ? Peeled, seeded, and soft-cooked fruits and vegetables.  ? Low-fat dairy products.   Take vitamin and mineral supplements as told by your health care provider.  Preventing dehydration   Start by sipping water or a special solution to prevent dehydration (oral rehydration solution, ORS). Urine that is clear or pale yellow means that you are getting enough fluid.   Try to drink at least 8-10 cups of fluid each day to help replace lost  fluids.   You may add other liquids in addition to water, such as clear juice or decaffeinated sports drinks, as tolerated or as told by your health care provider.   Avoid drinks with caffeine, such as coffee, tea, or soft drinks.   Avoid alcohol.  What foods are recommended?         The items listed may not be a complete list. Talk with your health care provider about what dietary choices are best for you.  Grains  White rice. White, French, or pita breads (fresh or toasted), including plain rolls, buns, or bagels. White pasta. Saltine, soda, or graham crackers. Pretzels. Low-fiber cereal. Cooked cereals made with water (such as cornmeal, farina, or cream cereals). Plain muffins. Matzo. Melba toast. Zwieback.  Vegetables  Potatoes (without the skin). Most well-cooked and canned vegetables without skins or seeds. Tender lettuce.  Fruits  Apple sauce. Fruits canned in juice. Cooked apricots, cherries, grapefruit, peaches, pears, or plums. Fresh bananas and cantaloupe.  Meats and other protein foods  Baked or boiled chicken. Eggs. Tofu. Fish. Seafood. Smooth nut butters. Ground or well-cooked tender beef, ham, veal, lamb, pork, or poultry.  Dairy  Plain yogurt, kefir, and unsweetened liquid yogurt. Lactose-free milk, buttermilk, skim milk, or soy milk. Low-fat or nonfat hard cheese.  Beverages  Water. Low-calorie sports drinks. Fruit juices without pulp. Strained tomato and vegetable juices. Decaffeinated teas. Sugar-free beverages not sweetened with sugar alcohols. Oral rehydration solutions, if   approved by your health care provider.  Seasoning and other foods  Bouillon, broth, or soups made from recommended foods.  What foods are not recommended?  The items listed may not be a complete list. Talk with your health care provider about what dietary choices are best for you.  Grains  Whole grain, whole wheat, bran, or rye breads, rolls, pastas, and crackers. Wild or brown rice. Whole grain or bran cereals. Barley.  Oats and oatmeal. Corn tortillas or taco shells. Granola. Popcorn.  Vegetables  Raw vegetables. Fried vegetables. Cabbage, broccoli, Brussels sprouts, artichokes, baked beans, beet greens, corn, kale, legumes, peas, sweet potatoes, and yams. Potato skins. Cooked spinach and cabbage.  Fruits  Dried fruit, including raisins and dates. Raw fruits. Stewed or dried prunes. Canned fruits with syrup.  Meat and other protein foods  Fried or fatty meats. Deli meats. Chunky nut butters. Nuts and seeds. Beans and lentils. Bacon. Hot dogs. Sausage.  Dairy  High-fat cheeses. Whole milk, chocolate milk, and beverages made with milk, such as milk shakes. Half-and-half. Cream. sour cream. Ice cream.  Beverages  Caffeinated beverages (such as coffee, tea, soda, or energy drinks). Alcoholic beverages. Fruit juices with pulp. Prune juice. Soft drinks sweetened with high-fructose corn syrup or sugar alcohols. High-calorie sports drinks.  Fats and oils  Butter. Cream sauces. Margarine. Salad oils. Plain salad dressings. Olives. Avocados. Mayonnaise.  Sweets and desserts  Sweet rolls, doughnuts, and sweet breads. Sugar-free desserts sweetened with sugar alcohols such as xylitol and sorbitol.  Seasoning and other foods  Honey. Hot sauce. Chili powder. Gravy. Cream-based or milk-based soups. Pancakes and waffles.  Summary   When you have diarrhea, the foods you eat and your eating habits are very important.   Make sure you get at least 8-10 cups of fluid each day, or enough to keep your urine clear or pale yellow.   Eat bland foods and gradually reintroduce healthy, nutrient-rich foods as tolerated, or as told by your health care provider.   Avoid high-fiber, fried, greasy, or spicy foods.  This information is not intended to replace advice given to you by your health care provider. Make sure you discuss any questions you have with your health care provider.  Document Released: 01/03/2004 Document Revised: 10/10/2016 Document Reviewed:  10/10/2016  Elsevier Interactive Patient Education  2019 [employer]

## 2018-12-13 ENCOUNTER — Other Ambulatory Visit: Payer: Self-pay | Admitting: Internal Medicine

## 2018-12-15 ENCOUNTER — Encounter: Payer: Self-pay | Admitting: Internal Medicine

## 2018-12-15 ENCOUNTER — Ambulatory Visit (INDEPENDENT_AMBULATORY_CARE_PROVIDER_SITE_OTHER): Payer: Managed Care, Other (non HMO) | Admitting: Internal Medicine

## 2018-12-15 DIAGNOSIS — E079 Disorder of thyroid, unspecified: Secondary | ICD-10-CM | POA: Diagnosis not present

## 2018-12-15 DIAGNOSIS — Z0000 Encounter for general adult medical examination without abnormal findings: Secondary | ICD-10-CM | POA: Diagnosis not present

## 2018-12-15 NOTE — Patient Instructions (Addendum)
Weighted blanket  Biotin Vitamin B complex  Cardiac CT calcium scoring test $150   Computed tomography, more commonly known as a CT or CAT scan, is a diagnostic medical imaging test. Like traditional x-rays, it produces multiple images or pictures of the inside of the body. The cross-sectional images generated during a CT scan can be reformatted in multiple planes. They can even generate three-dimensional images. These images can be viewed on a computer monitor, printed on film or by a 3D printer, or transferred to a CD or DVD. CT images of internal organs, bones, soft tissue and blood vessels provide greater detail than traditional x-rays, particularly of soft tissues and blood vessels. A cardiac CT scan for coronary calcium is a non-invasive way of obtaining information about the presence, location and extent of calcified plaque in the coronary arteries-the vessels that supply oxygen-containing blood to the heart muscle. Calcified plaque results when there is a build-up of fat and other substances under the inner layer of the artery. This material can calcify which signals the presence of atherosclerosis, a disease of the vessel wall, also called coronary artery disease (CAD). People with this disease have an increased risk for heart attacks. In addition, over time, progression of plaque build up (CAD) can narrow the arteries or even close off blood flow to the heart. The result may be chest pain, sometimes called "angina," or a heart attack. Because calcium is a marker of CAD, the amount of calcium detected on a cardiac CT scan is a helpful prognostic tool. The findings on cardiac CT are expressed as a calcium score. Another name for this test is coronary artery calcium scoring.  What are some common uses of the procedure? The goal of cardiac CT scan for calcium scoring is to determine if CAD is present and to what extent, even if there are no symptoms. It is a screening study that may be  recommended by a physician for patients with risk factors for CAD but no clinical symptoms. The major risk factors for CAD are: . high blood cholesterol levels  . family history of heart attacks  . diabetes  . high blood pressure  . cigarette smoking  . overweight or obese  . physical inactivity   A negative cardiac CT scan for calcium scoring shows no calcification within the coronary arteries. This suggests that CAD is absent or so minimal it cannot be seen by this technique. The chance of having a heart attack over the next two to five years is very low under these circumstances. A positive test means that CAD is present, regardless of whether or not the patient is experiencing any symptoms. The amount of calcification-expressed as the calcium score-may help to predict the likelihood of a myocardial infarction (heart attack) in the coming years and helps your medical doctor or cardiologist decide whether the patient may need to take preventive medicine or undertake other measures such as diet and exercise to lower the risk for heart attack. The extent of CAD is graded according to your calcium score:  Calcium Score  Presence of CAD  0 No evidence of CAD   1-10 Minimal evidence of CAD  11-100 Mild evidence of CAD  101-400 Moderate evidence of CAD  Over 400 Extensive evidence of CAD

## 2018-12-15 NOTE — Progress Notes (Signed)
Subjective:  Patient ID: Carol Mata, female    DOB: 08-30-1956  Age: 63 y.o. MRN: [id number]  CC: No chief complaint on file.   HPI Carol Mata presents for a well exam  Outpatient Medications Prior to Visit  Medication Sig Dispense Refill  . alendronate (FOSAMAX) 70 MG tablet TAKE 1 TABLET BY MOUTH EVERY 7 DAYS. TAKE WITH A FULL GLASS OF WATER ON AN EMPTY STOMACH. 12 tablet 4  . fluticasone (FLONASE) 50 MCG/ACT nasal spray Place 2 sprays into the nose as needed. 16 g 4  . levothyroxine (SYNTHROID) 88 MCG tablet Take 1 tablet (88 mcg total) by mouth daily. Must keep scheduled appt for future refills 30 tablet 0  . Vitamin D, Ergocalciferol, (DRISDOL) 1.25 MG (50000 UT) CAPS capsule Take 1 capsule (50,000 Units total) by mouth every 7 (seven) days. 12 capsule 0  . zolpidem (AMBIEN) 5 MG tablet TAKE ONE TABLET AT BEDTIME AS NEEDED FOR SLEEP 30 tablet 3   No facility-administered medications prior to visit.     ROS: Review of Systems  Constitutional: Negative for activity change, appetite change, chills, fatigue and unexpected weight change.  HENT: Negative for congestion, mouth sores and sinus pressure.   Eyes: Negative for visual disturbance.  Respiratory: Negative for cough and chest tightness.   Gastrointestinal: Negative for abdominal pain and nausea.  Genitourinary: Negative for difficulty urinating, frequency and vaginal pain.  Musculoskeletal: Negative for back pain and gait problem.  Skin: Negative for pallor and rash.  Neurological: Negative for dizziness, tremors, weakness, numbness and headaches.  Psychiatric/Behavioral: Negative for confusion, sleep disturbance and suicidal ideas.  hair thinning  Objective:  BP 114/68 (BP Location: Left Arm, Patient Position: Sitting, Cuff Size: Normal)   Pulse 78   Temp 97.9 F (36.6 C) (Oral)   Ht 5' 7.5" (1.715 m)   Wt 119 lb (54 kg)   SpO2 98%   BMI 18.36 kg/m   BP Readings from Last 3 Encounters:  12/15/18  114/68  12/08/18 112/76  10/28/18 116/72    Wt Readings from Last 3 Encounters:  12/15/18 119 lb (54 kg)  10/28/18 122 lb (55.3 kg)  08/19/18 119 lb (54 kg)    Physical Exam Constitutional:      General: She is not in acute distress.    Appearance: She is well-developed.  HENT:     Head: Normocephalic.     Right Ear: External ear normal.     Left Ear: External ear normal.     Nose: Nose normal.  Eyes:     General:        Right eye: No discharge.        Left eye: No discharge.     Conjunctiva/sclera: Conjunctivae normal.     Pupils: Pupils are equal, round, and reactive to light.  Neck:     Musculoskeletal: Normal range of motion and neck supple.     Thyroid: No thyromegaly.     Vascular: No JVD.     Trachea: No tracheal deviation.  Cardiovascular:     Rate and Rhythm: Normal rate and regular rhythm.     Heart sounds: Normal heart sounds.  Pulmonary:     Effort: No respiratory distress.     Breath sounds: No stridor. No wheezing.  Abdominal:     General: Bowel sounds are normal. There is no distension.     Palpations: Abdomen is soft. There is no mass.     Tenderness: There is no abdominal tenderness.  There is no guarding or rebound.  Musculoskeletal:        General: No tenderness.  Lymphadenopathy:     Cervical: No cervical adenopathy.  Skin:    Findings: No erythema or rash.  Neurological:     Cranial Nerves: No cranial nerve deficit.     Motor: No abnormal muscle tone.     Coordination: Coordination normal.     Deep Tendon Reflexes: Reflexes normal.  Psychiatric:        Behavior: Behavior normal.        Thought Content: Thought content normal.        Judgment: Judgment normal.     Lab Results  Component Value Date   WBC 3.7 (L) 10/28/2018   HGB 12.6 10/28/2018   HCT 37.2 10/28/2018   PLT 188 10/28/2018   GLUCOSE 69 10/28/2018   CHOL 198 10/28/2018   TRIG 67 10/28/2018   HDL 81 10/28/2018   LDLCALC 102 (H) 10/28/2018   ALT 12 10/28/2018   AST 21  10/28/2018   NA 138 10/28/2018   K 4.3 10/28/2018   CL 102 10/28/2018   CREATININE 0.71 10/28/2018   BUN 11 10/28/2018   CO2 29 10/28/2018   TSH 4.01 10/28/2018    Mm Digital Screening Bilateral  Result Date: 07/09/2018 CLINICAL DATA:  Screening. EXAM: DIGITAL SCREENING BILATERAL MAMMOGRAM WITH CAD COMPARISON:  Previous exam(s). ACR Breast Density Category d: The breast tissue is extremely dense, which lowers the sensitivity of mammography. FINDINGS: There are no findings suspicious for malignancy. Images were processed with CAD. IMPRESSION: No mammographic evidence of malignancy. A result letter of this screening mammogram will be mailed directly to the patient. RECOMMENDATION: Screening mammogram in one year. (Code:SM-B-01Y) BI-RADS CATEGORY  1: Negative. Electronically Signed   By: Frederico Hamman M.D.   On: 07/09/2018 16:38    Assessment & Plan:   There are no diagnoses linked to this encounter.   No orders of the defined types were placed in this encounter.    Follow-up: No follow-ups on file.  Sonda Primes, MD

## 2018-12-15 NOTE — Assessment & Plan Note (Signed)
We discussed age appropriate health related issues, including available/recomended screening tests and vaccinations. We discussed a need for adhering to healthy diet and exercise. Labs were ordered to be later reviewed . All questions were answered. A cardiac CT scan for coronary calcium offered 2/20 Colon 2015 nl Dr Kinnie Scales

## 2018-12-15 NOTE — Assessment & Plan Note (Signed)
Labs

## 2018-12-28 DIAGNOSIS — Z8249 Family history of ischemic heart disease and other diseases of the circulatory system: Secondary | ICD-10-CM

## 2019-01-22 ENCOUNTER — Other Ambulatory Visit: Payer: Self-pay | Admitting: Internal Medicine

## 2019-04-26 ENCOUNTER — Other Ambulatory Visit: Payer: Self-pay

## 2019-04-26 ENCOUNTER — Ambulatory Visit (INDEPENDENT_AMBULATORY_CARE_PROVIDER_SITE_OTHER)
Admission: RE | Admit: 2019-04-26 | Discharge: 2019-04-26 | Disposition: A | Discharge status: Home / Self Care | Payer: Managed Care, Other (non HMO) | Source: Ambulatory Visit | Attending: Internal Medicine | Admitting: Internal Medicine

## 2019-04-26 DIAGNOSIS — Z8249 Family history of ischemic heart disease and other diseases of the circulatory system: Secondary | ICD-10-CM

## 2019-07-12 ENCOUNTER — Other Ambulatory Visit: Payer: Self-pay | Admitting: Gynecology

## 2019-07-12 DIAGNOSIS — Z1231 Encounter for screening mammogram for malignant neoplasm of breast: Secondary | ICD-10-CM

## 2019-07-26 ENCOUNTER — Encounter: Payer: Self-pay | Admitting: Gynecology

## 2019-08-20 ENCOUNTER — Encounter (INDEPENDENT_AMBULATORY_CARE_PROVIDER_SITE_OTHER): Payer: Self-pay

## 2019-08-29 ENCOUNTER — Ambulatory Visit
Admission: RE | Admit: 2019-08-29 | Discharge: 2019-08-29 | Disposition: A | Discharge status: Home / Self Care | Payer: Managed Care, Other (non HMO) | Source: Ambulatory Visit | Attending: Gynecology | Admitting: Gynecology

## 2019-08-29 ENCOUNTER — Other Ambulatory Visit: Payer: Self-pay

## 2019-08-29 DIAGNOSIS — Z1231 Encounter for screening mammogram for malignant neoplasm of breast: Secondary | ICD-10-CM

## 2019-08-31 ENCOUNTER — Other Ambulatory Visit: Payer: Self-pay | Admitting: Gynecology

## 2019-08-31 ENCOUNTER — Encounter: Payer: Self-pay | Admitting: Gynecology

## 2019-08-31 DIAGNOSIS — R928 Other abnormal and inconclusive findings on diagnostic imaging of breast: Secondary | ICD-10-CM

## 2019-09-08 ENCOUNTER — Other Ambulatory Visit: Payer: Managed Care, Other (non HMO)

## 2019-09-16 ENCOUNTER — Telehealth: Payer: Self-pay | Admitting: *Deleted

## 2019-09-16 DIAGNOSIS — Z1329 Encounter for screening for other suspected endocrine disorder: Secondary | ICD-10-CM

## 2019-09-16 DIAGNOSIS — Z13220 Encounter for screening for lipoid disorders: Secondary | ICD-10-CM

## 2019-09-16 DIAGNOSIS — Z01419 Encounter for gynecological examination (general) (routine) without abnormal findings: Secondary | ICD-10-CM

## 2019-09-16 DIAGNOSIS — Z1321 Encounter for screening for nutritional disorder: Secondary | ICD-10-CM

## 2019-09-16 DIAGNOSIS — M818 Other osteoporosis without current pathological fracture: Secondary | ICD-10-CM

## 2019-09-16 NOTE — Telephone Encounter (Signed)
For labs I would recommend CBC, comprehensive metabolic panel, lipid profile, TSH and vitamin D.

## 2019-09-16 NOTE — Telephone Encounter (Signed)
Patient called requesting to schedule dexa, was suppose to do in Spring this year, but to [name] patient did have done, would like to do so now. She will schedule this.   She has annual scheduled with Dr. Delilah Shan on 11/16/19, would like labs done prior asked if you would be willing to order then or should she wait for Dr. Delilah Shan to order?

## 2019-09-16 NOTE — Telephone Encounter (Signed)
Order placed for Dexa, and labs will route to appointment desk to schedule dexa and labs prior to annual exam

## 2019-09-19 ENCOUNTER — Ambulatory Visit: Payer: Managed Care, Other (non HMO)

## 2019-09-19 ENCOUNTER — Ambulatory Visit
Admission: RE | Admit: 2019-09-19 | Discharge: 2019-09-19 | Disposition: A | Discharge status: Home / Self Care | Payer: Managed Care, Other (non HMO) | Source: Ambulatory Visit | Attending: Gynecology | Admitting: Gynecology

## 2019-09-19 ENCOUNTER — Other Ambulatory Visit: Payer: Self-pay

## 2019-09-19 DIAGNOSIS — R928 Other abnormal and inconclusive findings on diagnostic imaging of breast: Secondary | ICD-10-CM

## 2019-09-26 NOTE — Telephone Encounter (Signed)
Called patient schedules LAB apt for jan 20 @ 8:30. And her DEXA is DEC 9 @ 9:00.

## 2019-10-04 ENCOUNTER — Other Ambulatory Visit: Payer: Self-pay

## 2019-10-05 ENCOUNTER — Encounter: Payer: Self-pay | Admitting: Gynecology

## 2019-10-05 ENCOUNTER — Other Ambulatory Visit: Payer: Self-pay | Admitting: Gynecology

## 2019-10-05 ENCOUNTER — Ambulatory Visit (INDEPENDENT_AMBULATORY_CARE_PROVIDER_SITE_OTHER): Payer: Managed Care, Other (non HMO)

## 2019-10-05 DIAGNOSIS — M810 Age-related osteoporosis without current pathological fracture: Secondary | ICD-10-CM

## 2019-10-05 DIAGNOSIS — M818 Other osteoporosis without current pathological fracture: Secondary | ICD-10-CM

## 2019-10-05 DIAGNOSIS — Z780 Asymptomatic menopausal state: Secondary | ICD-10-CM

## 2019-10-12 ENCOUNTER — Other Ambulatory Visit: Payer: Managed Care, Other (non HMO)

## 2019-10-24 ENCOUNTER — Ambulatory Visit: Payer: Managed Care, Other (non HMO) | Attending: Internal Medicine

## 2019-10-24 DIAGNOSIS — U071 COVID-19: Secondary | ICD-10-CM

## 2019-10-24 DIAGNOSIS — R238 Other skin changes: Secondary | ICD-10-CM

## 2019-10-26 LAB — NOVEL CORONAVIRUS, NAA: SARS-CoV-2, NAA: NOT DETECTED

## 2019-11-07 ENCOUNTER — Other Ambulatory Visit: Payer: Self-pay | Admitting: Internal Medicine

## 2019-11-07 ENCOUNTER — Ambulatory Visit: Payer: Managed Care, Other (non HMO) | Attending: Internal Medicine

## 2019-11-07 DIAGNOSIS — Z20822 Contact with and (suspected) exposure to covid-19: Secondary | ICD-10-CM

## 2019-11-07 MED ORDER — SYNTHROID 88 MCG PO TABS: 88.0000 ug | ORAL_TABLET | Freq: Every day | ORAL | 11 refills | Status: DC

## 2019-11-08 ENCOUNTER — Other Ambulatory Visit: Payer: Managed Care, Other (non HMO)

## 2019-11-09 LAB — NOVEL CORONAVIRUS, NAA: SARS-CoV-2, NAA: NOT DETECTED

## 2019-11-10 ENCOUNTER — Encounter: Payer: Managed Care, Other (non HMO) | Admitting: Gynecology

## 2019-11-16 ENCOUNTER — Other Ambulatory Visit: Payer: Self-pay

## 2019-11-16 ENCOUNTER — Other Ambulatory Visit: Payer: Managed Care, Other (non HMO)

## 2019-11-16 DIAGNOSIS — Z13220 Encounter for screening for lipoid disorders: Secondary | ICD-10-CM

## 2019-11-16 DIAGNOSIS — Z1329 Encounter for screening for other suspected endocrine disorder: Secondary | ICD-10-CM

## 2019-11-16 DIAGNOSIS — Z1321 Encounter for screening for nutritional disorder: Secondary | ICD-10-CM

## 2019-11-16 DIAGNOSIS — Z01419 Encounter for gynecological examination (general) (routine) without abnormal findings: Secondary | ICD-10-CM

## 2019-11-17 LAB — TSH: TSH: 1.9 mIU/L (ref 0.40–4.50)

## 2019-11-17 LAB — LIPID PANEL
Cholesterol: 204 mg/dL — ABNORMAL HIGH (ref <200)
HDL: 86 mg/dL (ref >=50)
LDL Cholesterol (Calc): 102 mg/dL (calc) — ABNORMAL HIGH
Non-HDL Cholesterol (Calc): 118 mg/dL (calc) (ref <130)
Total CHOL/HDL Ratio: 2.4 (calc) (ref <5.0)
Triglycerides: 70 mg/dL (ref <150)

## 2019-11-17 LAB — VITAMIN D 25 HYDROXY (VIT D DEFICIENCY, FRACTURES): Vit D, 25-Hydroxy: 25 ng/mL — ABNORMAL LOW (ref 30–100)

## 2019-11-17 LAB — CBC
HCT: 38.2 % (ref 35.0–45.0)
Hemoglobin: 13.1 g/dL (ref 11.7–15.5)
MCH: 30.7 pg (ref 27.0–33.0)
MCHC: 34.3 g/dL (ref 32.0–36.0)
MCV: 89.5 fL (ref 80.0–100.0)
MPV: 9.8 fL (ref 7.5–12.5)
Platelets: 176 10*3/uL (ref 140–400)
RBC: 4.27 10*6/uL (ref 3.80–5.10)
RDW: 12.1 % (ref 11.0–15.0)
WBC: 3.9 10*3/uL (ref 3.8–10.8)

## 2019-11-17 LAB — COMPREHENSIVE METABOLIC PANEL
AG Ratio: 1.9 (calc) (ref 1.0–2.5)
ALT: 21 U/L (ref 6–29)
AST: 26 U/L (ref 10–35)
Albumin: 4.2 g/dL (ref 3.6–5.1)
Alkaline phosphatase (APISO): 54 U/L (ref 37–153)
BUN: 11 mg/dL (ref 7–25)
CO2: 32 mmol/L (ref 20–32)
Calcium: 9.4 mg/dL (ref 8.6–10.4)
Chloride: 104 mmol/L (ref 98–110)
Creat: 0.71 mg/dL (ref 0.50–0.99)
Globulin: 2.2 g/dL (calc) (ref 1.9–3.7)
Glucose, Bld: 83 mg/dL (ref 65–99)
Potassium: 4.6 mmol/L (ref 3.5–5.3)
Sodium: 141 mmol/L (ref 135–146)
Total Bilirubin: 0.7 mg/dL (ref 0.2–1.2)
Total Protein: 6.4 g/dL (ref 6.1–8.1)

## 2019-11-18 NOTE — Progress Notes (Signed)
Will plan to discuss with her at the next appt.

## 2019-11-23 ENCOUNTER — Other Ambulatory Visit: Payer: Self-pay

## 2019-11-24 ENCOUNTER — Encounter: Payer: Self-pay | Admitting: Obstetrics and Gynecology

## 2019-11-24 ENCOUNTER — Ambulatory Visit (INDEPENDENT_AMBULATORY_CARE_PROVIDER_SITE_OTHER): Payer: Managed Care, Other (non HMO) | Admitting: Obstetrics and Gynecology

## 2019-11-24 VITALS — BP 116/74 | Ht 67.5 in | Wt 119.0 lb

## 2019-11-24 DIAGNOSIS — Z01419 Encounter for gynecological examination (general) (routine) without abnormal findings: Secondary | ICD-10-CM

## 2019-11-24 DIAGNOSIS — E7800 Pure hypercholesterolemia, unspecified: Secondary | ICD-10-CM | POA: Diagnosis not present

## 2019-11-24 DIAGNOSIS — E559 Vitamin D deficiency, unspecified: Secondary | ICD-10-CM

## 2019-11-24 MED ORDER — FLUTICASONE PROPIONATE 50 MCG/ACT NA SUSP: 2.0000 | NASAL | 4 refills | Status: DC | PRN

## 2019-11-24 NOTE — Progress Notes (Signed)
   Carol Mata 1956-09-21 [id number]  SUBJECTIVE:  64 y.o. [id number] female for annual routine gynecologic exam. She has no gynecologic concerns.  Current Outpatient Medications  Medication Sig Dispense Refill  . fluticasone (FLONASE) 50 MCG/ACT nasal spray Place 2 sprays into the nose as needed. 16 g 4  . SYNTHROID 88 MCG tablet Take 1 tablet (88 mcg total) by mouth daily before breakfast. 30 tablet 11  . zolpidem (AMBIEN) 5 MG tablet TAKE ONE TABLET AT BEDTIME AS NEEDED FOR SLEEP 30 tablet 3   No current facility-administered medications for this visit.   Allergies: Lactose intolerance (gi)  No LMP recorded. Patient is postmenopausal.  Past medical history,surgical history, problem list, medications, allergies, family history and social history were all reviewed and documented as reviewed in the EPIC chart.  ROS:  Feeling well. No dyspnea or chest pain on exertion.  No abdominal pain, change in bowel habits, black or bloody stools.  No urinary tract symptoms. GYN ROS: no abnormal bleeding, pelvic pain or discharge, no breast pain or new or enlarging lumps on self exam. No neurological complaints.   OBJECTIVE:  BP 116/74   Ht 5' 7.5" (1.715 m)   Wt 119 lb (54 kg)   BMI 18.36 kg/m  The patient appears well, alert, oriented x 3, in no distress. ENT normal.  Neck supple. No cervical or supraclavicular adenopathy or thyromegaly.  Lungs are clear, good air entry, no wheezes, rhonchi or rales. S1 and S2 normal, no murmurs, regular rate and rhythm.  Abdomen soft without tenderness, guarding, mass or organomegaly.  Neurological is normal, no focal findings.  BREAST EXAM: breasts appear normal, no suspicious masses, no skin or nipple changes or axillary nodes  PELVIC EXAM: VULVA: normal appearing vulva with no masses, tenderness or lesions, atrophic changes noted. VAGINA: normal appearing vagina with normal color and discharge, no lesions, CERVIX: normal appearing cervix without  discharge or lesions, UTERUS: uterus is normal size, shape, consistency and nontender, ADNEXA: normal adnexa in size, nontender and no masses  Chaperone: [name] present during the examination  ASSESSMENT:  64 y.o. [id number] here for annual gynecologic exam  PLAN:   1. Postmenopause. No gynecologic concerns. 2. Pap smear 10/2018 normal. Not repeated today. No recent history of abnormal Pap smears. Next Pap smear due 2023 following the current screening guidelines calling for the 3-year interval. 3. Mammogram 08/2019. Will continue with annual mammography. Breast exam normal today. 4. Colonoscopy 2015. Recommended that she continue per the prescribed interval.   5. Osteoporosis.  DEXA 09/2019 was stable with T score -1.7 compared to 2018.  She is taking a medication holiday from Fosamax for 2 years and will then repeat the DEXA in 2022. 6. Insomnia. Takes Ambien prn. 7. Flonase refilled. 8. Health maintenance.  Recent CBC, CMP, lipid profile, TSH, and vitamin D reviewed. She is taking EmergenC with vitamin D, so I did ask her to make sure she is getting the full 1000 IU per day so her level improves. Would be okay to increase to 1500 IU per day since she is low.  Cholesterol and LDL slightly high, recommended regular exercise and may consider discussing medication with her primary care provider. Recommend rechecking lipids next year.  Return annually or sooner, prn.  Ilda Foil MD  11/24/19

## 2019-11-24 NOTE — Patient Instructions (Addendum)
It was nice to meet you today! Please remember to check to make sure you are getting 1000 IU of vitamin D per day We will plan to check the bone density scan again in 2 years Your cholesterol level was slightly high, so with regular exercise activity if that number does not improve, you may want to consider discussing medication with your primary care provider.

## 2019-12-06 MED ORDER — LEVOTHYROXINE SODIUM 88 MCG PO TABS: 88.0000 ug | ORAL_TABLET | Freq: Every day | ORAL | 11 refills | Status: DC

## 2019-12-21 ENCOUNTER — Ambulatory Visit: Payer: Managed Care, Other (non HMO) | Attending: Internal Medicine

## 2019-12-21 DIAGNOSIS — Z20822 Contact with and (suspected) exposure to covid-19: Secondary | ICD-10-CM

## 2019-12-22 LAB — NOVEL CORONAVIRUS, NAA: SARS-CoV-2, NAA: NOT DETECTED

## 2019-12-30 ENCOUNTER — Ambulatory Visit: Payer: Managed Care, Other (non HMO) | Attending: Internal Medicine

## 2019-12-30 DIAGNOSIS — Z20822 Contact with and (suspected) exposure to covid-19: Secondary | ICD-10-CM

## 2019-12-31 LAB — NOVEL CORONAVIRUS, NAA: SARS-CoV-2, NAA: NOT DETECTED

## 2020-01-19 ENCOUNTER — Ambulatory Visit (INDEPENDENT_AMBULATORY_CARE_PROVIDER_SITE_OTHER): Payer: Managed Care, Other (non HMO) | Admitting: Internal Medicine

## 2020-01-19 ENCOUNTER — Other Ambulatory Visit: Payer: Self-pay

## 2020-01-19 ENCOUNTER — Encounter: Payer: Self-pay | Admitting: Internal Medicine

## 2020-01-19 VITALS — BP 120/72 | HR 81 | Temp 98.5°F | Ht 67.5 in | Wt 118.0 lb

## 2020-01-19 DIAGNOSIS — Z0000 Encounter for general adult medical examination without abnormal findings: Secondary | ICD-10-CM | POA: Diagnosis not present

## 2020-01-19 DIAGNOSIS — I2583 Coronary atherosclerosis due to lipid rich plaque: Secondary | ICD-10-CM

## 2020-01-19 DIAGNOSIS — R202 Paresthesia of skin: Secondary | ICD-10-CM

## 2020-01-19 DIAGNOSIS — I2510 Atherosclerotic heart disease of native coronary artery without angina pectoris: Secondary | ICD-10-CM | POA: Insufficient documentation

## 2020-01-19 DIAGNOSIS — Z8249 Family history of ischemic heart disease and other diseases of the circulatory system: Secondary | ICD-10-CM | POA: Diagnosis not present

## 2020-01-19 DIAGNOSIS — M810 Age-related osteoporosis without current pathological fracture: Secondary | ICD-10-CM

## 2020-01-19 DIAGNOSIS — E559 Vitamin D deficiency, unspecified: Secondary | ICD-10-CM | POA: Diagnosis not present

## 2020-01-19 DIAGNOSIS — E785 Hyperlipidemia, unspecified: Secondary | ICD-10-CM | POA: Diagnosis not present

## 2020-01-19 LAB — LIPID PANEL
Cholesterol: 178 mg/dL (ref 0–200)
HDL: 80.3 mg/dL
LDL Cholesterol: 88 mg/dL (ref 0–99)
NonHDL: 98.13
Total CHOL/HDL Ratio: 2
Triglycerides: 49 mg/dL (ref 0.0–149.0)
VLDL: 9.8 mg/dL (ref 0.0–40.0)

## 2020-01-19 LAB — HEPATIC FUNCTION PANEL
ALT: 14 U/L (ref 0–35)
AST: 22 U/L (ref 0–37)
Albumin: 4.3 g/dL (ref 3.5–5.2)
Alkaline Phosphatase: 56 U/L (ref 39–117)
Bilirubin, Direct: 0.2 mg/dL (ref 0.0–0.3)
Total Bilirubin: 0.8 mg/dL (ref 0.2–1.2)
Total Protein: 6.9 g/dL (ref 6.0–8.3)

## 2020-01-19 LAB — VITAMIN B12: Vitamin B-12: 239 pg/mL (ref 211–911)

## 2020-01-19 MED ORDER — VITAMIN D3 1.25 MG (50000 UT) PO CAPS: 1.0000 | ORAL_CAPSULE | ORAL | 0 refills | Status: DC

## 2020-01-19 MED ORDER — ASPIRIN EC 81 MG PO TBEC: 81.0000 mg | DELAYED_RELEASE_TABLET | Freq: Every day | ORAL | 3 refills | Status: AC

## 2020-01-19 MED ORDER — VITAMIN D3 50 MCG (2000 UT) PO CAPS: 2000.0000 [IU] | ORAL_CAPSULE | Freq: Every day | ORAL | 3 refills | Status: DC

## 2020-01-19 MED ORDER — PRAVASTATIN SODIUM 20 MG PO TABS: 20.0000 mg | ORAL_TABLET | Freq: Every day | ORAL | 3 refills | Status: DC

## 2020-01-19 MED ORDER — LEVOTHYROXINE SODIUM 88 MCG PO TABS: 88.0000 ug | ORAL_TABLET | Freq: Every day | ORAL | 3 refills | Status: DC

## 2020-01-19 NOTE — Assessment & Plan Note (Signed)
Vit D 50000 iu q wk x6, then 2000 iu qd

## 2020-01-19 NOTE — Assessment & Plan Note (Signed)
Discussed 6/20 CT Coronary calcium score of 115. This was 51 th percentile for age and sex matched control. ASA 81 Pravastatin - low dose

## 2020-01-19 NOTE — Progress Notes (Signed)
Subjective:  Patient ID: Carol Mata, female    DOB: 1956/02/12  Age: 64 y.o. MRN: [id number]  CC: No chief complaint on file.   HPI JEANINNE LODICO presents for a well exam  We need to discuss CT, hypothyroidism, low vit D  Occ numbness on fingertips  Outpatient Medications Prior to Visit  Medication Sig Dispense Refill  . fluticasone (FLONASE) 50 MCG/ACT nasal spray Place 2 sprays into both nostrils as needed. 16 g 4  . levothyroxine (SYNTHROID) 88 MCG tablet Take 1 tablet (88 mcg total) by mouth daily before breakfast. 30 tablet 11  . zolpidem (AMBIEN) 5 MG tablet TAKE ONE TABLET AT BEDTIME AS NEEDED FOR SLEEP 30 tablet 3   No facility-administered medications prior to visit.    ROS: Review of Systems  Constitutional: Negative for activity change, appetite change, chills, fatigue and unexpected weight change.  HENT: Negative for congestion, mouth sores and sinus pressure.   Eyes: Negative for visual disturbance.  Respiratory: Negative for cough and chest tightness.   Gastrointestinal: Negative for abdominal pain and nausea.  Genitourinary: Negative for difficulty urinating, frequency and vaginal pain.  Musculoskeletal: Negative for back pain and gait problem.  Skin: Negative for pallor and rash.  Neurological: Negative for dizziness, tremors, weakness, numbness and headaches.  Psychiatric/Behavioral: Negative for confusion, sleep disturbance and suicidal ideas.    Objective:  BP 120/72 (BP Location: Left Arm, Patient Position: Sitting, Cuff Size: Normal)   Pulse 81   Temp 98.5 F (36.9 C) (Oral)   Ht 5' 7.5" (1.715 m)   Wt 118 lb (53.5 kg)   SpO2 99%   BMI 18.21 kg/m   BP Readings from Last 3 Encounters:  01/19/20 120/72  11/24/19 116/74  12/15/18 114/68    Wt Readings from Last 3 Encounters:  01/19/20 118 lb (53.5 kg)  11/24/19 119 lb (54 kg)  12/15/18 119 lb (54 kg)    Physical Exam Constitutional:      General: She is not in acute distress.    Appearance: She is well-developed.  HENT:     Head: Normocephalic.     Right Ear: External ear normal.     Left Ear: External ear normal.     Nose: Nose normal.  Eyes:     General:        Right eye: No discharge.        Left eye: No discharge.     Conjunctiva/sclera: Conjunctivae normal.     Pupils: Pupils are equal, round, and reactive to light.  Neck:     Thyroid: No thyromegaly.     Vascular: No JVD.     Trachea: No tracheal deviation.  Cardiovascular:     Rate and Rhythm: Normal rate and regular rhythm.     Heart sounds: Normal heart sounds.  Pulmonary:     Effort: No respiratory distress.     Breath sounds: No stridor. No wheezing.  Abdominal:     General: Bowel sounds are normal. There is no distension.     Palpations: Abdomen is soft. There is no mass.     Tenderness: There is no abdominal tenderness. There is no guarding or rebound.  Musculoskeletal:        General: No tenderness.     Cervical back: Normal range of motion and neck supple.  Lymphadenopathy:     Cervical: No cervical adenopathy.  Skin:    Findings: No erythema or rash.  Neurological:     Cranial Nerves: No cranial  nerve deficit.     Motor: No abnormal muscle tone.     Coordination: Coordination normal.     Deep Tendon Reflexes: Reflexes normal.  Psychiatric:        Behavior: Behavior normal.        Thought Content: Thought content normal.        Judgment: Judgment normal.   tiny tremor in fingers  Lab Results  Component Value Date   WBC 3.9 11/16/2019   HGB 13.1 11/16/2019   HCT 38.2 11/16/2019   PLT 176 11/16/2019   GLUCOSE 83 11/16/2019   CHOL 204 (H) 11/16/2019   TRIG 70 11/16/2019   HDL 86 11/16/2019   LDLCALC 102 (H) 11/16/2019   ALT 21 11/16/2019   AST 26 11/16/2019   NA 141 11/16/2019   K 4.6 11/16/2019   CL 104 11/16/2019   CREATININE 0.71 11/16/2019   BUN 11 11/16/2019   CO2 32 11/16/2019   TSH 1.90 11/16/2019    MM DIAG BREAST TOMO UNI RIGHT  Result Date:  09/19/2019 CLINICAL DATA:  Patient presents today recall from screening for a possible right breast asymmetry. EXAM: DIGITAL DIAGNOSTIC UNILATERAL RIGHT MAMMOGRAM WITH CAD AND TOMO COMPARISON:  Previous exam(s). ACR Breast Density Category d: The breast tissue is extremely dense, which lowers the sensitivity of mammography. FINDINGS: Full field tomosynthesis and spot compression views were obtained for the questioned asymmetry in the superior and far posterior right breast. On the additional imaging the tissue in this area disperses without underlying mass or distortion present. Mammographic images were processed with CAD. IMPRESSION: No mammographic evidence of malignancy in the right breast. RECOMMENDATION: Screening mammogram in one year.(Code:SM-B-01Y) I have discussed the findings and recommendations with the patient. If applicable, a reminder letter will be sent to the patient regarding the next appointment. BI-RADS CATEGORY  1: Negative. Electronically Signed   By: Emmaline Kluver M.D.   On: 09/19/2019 10:55    Assessment & Plan:   There are no diagnoses linked to this encounter.   No orders of the defined types were placed in this encounter.    Follow-up: No follow-ups on file.  Sonda Primes, MD

## 2020-01-19 NOTE — Assessment & Plan Note (Signed)
We discussed age appropriate health related issues, including available/recomended screening tests and vaccinations. We discussed a need for adhering to healthy diet and exercise. Labs were reviewed . All questions were answered. A cardiac CT scan for coronary calcium score 115 - 2021 Colon 2015, 2020 nl Dr Kinnie Scales

## 2020-01-19 NOTE — Assessment & Plan Note (Signed)
Vit D  yoga 

## 2020-01-21 ENCOUNTER — Other Ambulatory Visit: Payer: Self-pay | Admitting: Internal Medicine

## 2020-01-21 MED ORDER — B COMPLEX PO TABS: 1.0000 | ORAL_TABLET | Freq: Every day | ORAL | 3 refills | Status: DC

## 2020-01-27 ENCOUNTER — Ambulatory Visit: Payer: Managed Care, Other (non HMO) | Attending: Internal Medicine

## 2020-01-27 DIAGNOSIS — Z20822 Contact with and (suspected) exposure to covid-19: Secondary | ICD-10-CM

## 2020-01-28 LAB — NOVEL CORONAVIRUS, NAA: SARS-CoV-2, NAA: NOT DETECTED

## 2020-01-28 LAB — SARS-COV-2, NAA 2 DAY TAT

## 2020-03-14 ENCOUNTER — Other Ambulatory Visit: Payer: Self-pay | Admitting: Internal Medicine

## 2020-04-30 ENCOUNTER — Telehealth: Payer: Self-pay | Admitting: Physician Assistant

## 2020-04-30 NOTE — Telephone Encounter (Signed)
Called to discuss with patient about Covid symptoms and the use of bamlanivimab/etesevimab or casirivimab/imdevimab, a monoclonal antibody infusion for those with mild to moderate Covid symptoms and at a high risk of hospitalization.  Pt is qualified for this infusion at the [facility] infusion center due to elevated calcium score.   Her husband is going to the [place] clinic for Mab infusion and I have given her name to them as well.   Cline Crock PA-C  [facility]

## 2020-05-01 ENCOUNTER — Telehealth: Payer: Self-pay | Admitting: Internal Medicine

## 2020-05-01 NOTE — Telephone Encounter (Signed)
New message:    Pt is calling and states she would like to know if it is okay if her and her spouse get the covid infusion due to them testing positive even after getting the vaccination. She states they have an appt in about an hour and want to make sure it is okay with Dr. Macario Golds before they do it. Pt has sent MyChart message as well. Please advise.

## 2020-05-01 NOTE — Telephone Encounter (Signed)
Pt has called this am for MD response. See previous msg../lmb

## 2020-05-02 ENCOUNTER — Other Ambulatory Visit: Payer: Self-pay | Admitting: Internal Medicine

## 2020-05-02 DIAGNOSIS — U071 COVID-19: Secondary | ICD-10-CM

## 2020-05-02 NOTE — Telephone Encounter (Signed)
Addressed via MyChart thx

## 2020-05-06 ENCOUNTER — Encounter (INDEPENDENT_AMBULATORY_CARE_PROVIDER_SITE_OTHER): Payer: Self-pay

## 2020-05-30 ENCOUNTER — Other Ambulatory Visit: Payer: Self-pay

## 2020-05-30 ENCOUNTER — Other Ambulatory Visit: Payer: Managed Care, Other (non HMO)

## 2020-05-30 ENCOUNTER — Telehealth: Payer: Self-pay

## 2020-05-30 DIAGNOSIS — R202 Paresthesia of skin: Secondary | ICD-10-CM

## 2020-05-30 DIAGNOSIS — E559 Vitamin D deficiency, unspecified: Secondary | ICD-10-CM

## 2020-05-30 DIAGNOSIS — Z8249 Family history of ischemic heart disease and other diseases of the circulatory system: Secondary | ICD-10-CM

## 2020-05-30 DIAGNOSIS — E785 Hyperlipidemia, unspecified: Secondary | ICD-10-CM

## 2020-05-30 DIAGNOSIS — I2583 Coronary atherosclerosis due to lipid rich plaque: Secondary | ICD-10-CM

## 2020-05-30 LAB — VITAMIN D 25 HYDROXY (VIT D DEFICIENCY, FRACTURES): Vit D, 25-Hydroxy: 36 ng/mL (ref 30–100)

## 2020-05-30 LAB — VITAMIN B12: Vitamin B-12: 424 pg/mL (ref 200–1100)

## 2020-05-30 NOTE — Telephone Encounter (Signed)
Placed labs for pt.

## 2020-05-31 LAB — LIPID PANEL
Cholesterol: 166 mg/dL (ref <200)
HDL: 79 mg/dL (ref >=50)
LDL Cholesterol (Calc): 73 mg/dL (calc)
Non-HDL Cholesterol (Calc): 87 mg/dL (calc) (ref <130)
Total CHOL/HDL Ratio: 2.1 (calc) (ref <5.0)
Triglycerides: 59 mg/dL (ref <150)

## 2020-07-23 ENCOUNTER — Other Ambulatory Visit: Payer: Self-pay

## 2020-07-23 ENCOUNTER — Encounter: Payer: Self-pay | Admitting: Internal Medicine

## 2020-07-23 ENCOUNTER — Ambulatory Visit: Payer: Managed Care, Other (non HMO) | Admitting: Internal Medicine

## 2020-07-23 VITALS — BP 110/68 | HR 69 | Temp 98.2°F | Ht 67.5 in | Wt 116.0 lb

## 2020-07-23 DIAGNOSIS — E559 Vitamin D deficiency, unspecified: Secondary | ICD-10-CM

## 2020-07-23 DIAGNOSIS — I2510 Atherosclerotic heart disease of native coronary artery without angina pectoris: Secondary | ICD-10-CM

## 2020-07-23 DIAGNOSIS — E039 Hypothyroidism, unspecified: Secondary | ICD-10-CM | POA: Diagnosis not present

## 2020-07-23 DIAGNOSIS — I2583 Coronary atherosclerosis due to lipid rich plaque: Secondary | ICD-10-CM

## 2020-07-23 DIAGNOSIS — G4700 Insomnia, unspecified: Secondary | ICD-10-CM

## 2020-07-23 DIAGNOSIS — Z0000 Encounter for general adult medical examination without abnormal findings: Secondary | ICD-10-CM

## 2020-07-23 MED ORDER — ICOSAPENT ETHYL 1 G PO CAPS: 2.0000 g | ORAL_CAPSULE | Freq: Two times a day (BID) | ORAL | 11 refills | Status: DC

## 2020-07-23 NOTE — Assessment & Plan Note (Signed)
Hypothyroidism Labs

## 2020-07-23 NOTE — Assessment & Plan Note (Addendum)
Pravastatin - not taking Try Vascepa

## 2020-07-23 NOTE — Assessment & Plan Note (Signed)
Zolpidem prn  Potential benefits of a long term opioids use as well as potential risks (i.e. addiction risk, apnea etc) and complications (i.e. Somnolence, constipation and others) were explained to the patient and were aknowledged.

## 2020-07-23 NOTE — Assessment & Plan Note (Signed)
Vit D 

## 2020-07-23 NOTE — Progress Notes (Signed)
Subjective:  Patient ID: Carol Mata, female    DOB: 1956-03-16  Age: 64 y.o. MRN: [id number]  CC: Follow-up   HPI JAIONNA WEISSE presents for dyslipidemia, hypothyroidism, insomnia  Outpatient Medications Prior to Visit  Medication Sig Dispense Refill  . b complex vitamins tablet Take 1 tablet by mouth daily. 100 tablet 3  . Cholecalciferol (VITAMIN D3) 50 MCG (2000 UT) capsule Take 1 capsule (2,000 Units total) by mouth daily. 100 capsule 3  . fluticasone (FLONASE) 50 MCG/ACT nasal spray Place 2 sprays into both nostrils as needed. 16 g 4  . levothyroxine (SYNTHROID) 88 MCG tablet Take 1 tablet (88 mcg total) by mouth daily before breakfast. 90 tablet 3  . zolpidem (AMBIEN) 5 MG tablet TAKE ONE TABLET AT BEDTIME AS NEEDED FOR SLEEP 30 tablet 3  . aspirin EC 81 MG tablet Take 1 tablet (81 mg total) by mouth daily. 100 tablet 3  . pravastatin (PRAVACHOL) 20 MG tablet Take 1 tablet (20 mg total) by mouth daily. 90 tablet 3   No facility-administered medications prior to visit.    ROS: Review of Systems  Constitutional: Negative for activity change, appetite change, chills, fatigue and unexpected weight change.  HENT: Negative for congestion, mouth sores and sinus pressure.   Eyes: Negative for visual disturbance.  Respiratory: Negative for cough and chest tightness.   Gastrointestinal: Negative for abdominal pain and nausea.  Genitourinary: Negative for difficulty urinating, frequency and vaginal pain.  Musculoskeletal: Negative for back pain and gait problem.  Skin: Negative for pallor and rash.  Neurological: Negative for dizziness, tremors, weakness, numbness and headaches.  Psychiatric/Behavioral: Negative for confusion and sleep disturbance.    Objective:  BP 110/68 (BP Location: Left Arm, Patient Position: Sitting, Cuff Size: Normal)   Pulse 69   Temp 98.2 F (36.8 C) (Oral)   Ht 5' 7.5" (1.715 m)   Wt 116 lb (52.6 kg)   SpO2 99%   BMI 17.90 kg/m   BP  Readings from Last 3 Encounters:  07/23/20 110/68  01/19/20 120/72  11/24/19 116/74    Wt Readings from Last 3 Encounters:  07/23/20 116 lb (52.6 kg)  01/19/20 118 lb (53.5 kg)  11/24/19 119 lb (54 kg)    Physical Exam Constitutional:      General: She is not in acute distress.    Appearance: She is well-developed.  HENT:     Head: Normocephalic.     Right Ear: External ear normal.     Left Ear: External ear normal.     Nose: Nose normal.  Eyes:     General:        Right eye: No discharge.        Left eye: No discharge.     Conjunctiva/sclera: Conjunctivae normal.     Pupils: Pupils are equal, round, and reactive to light.  Neck:     Thyroid: No thyromegaly.     Vascular: No JVD.     Trachea: No tracheal deviation.  Cardiovascular:     Rate and Rhythm: Normal rate and regular rhythm.     Heart sounds: Normal heart sounds.  Pulmonary:     Effort: No respiratory distress.     Breath sounds: No stridor. No wheezing.  Abdominal:     General: Bowel sounds are normal. There is no distension.     Palpations: Abdomen is soft. There is no mass.     Tenderness: There is no abdominal tenderness. There is no guarding or rebound.  Musculoskeletal:        General: No tenderness.     Cervical back: Normal range of motion and neck supple.  Lymphadenopathy:     Cervical: No cervical adenopathy.  Skin:    Findings: No erythema or rash.  Neurological:     Cranial Nerves: No cranial nerve deficit.     Motor: No abnormal muscle tone.     Coordination: Coordination normal.     Deep Tendon Reflexes: Reflexes normal.  Psychiatric:        Behavior: Behavior normal.        Thought Content: Thought content normal.        Judgment: Judgment normal.     Lab Results  Component Value Date   WBC 3.9 11/16/2019   HGB 13.1 11/16/2019   HCT 38.2 11/16/2019   PLT 176 11/16/2019   GLUCOSE 83 11/16/2019   CHOL 166 05/30/2020   TRIG 59 05/30/2020   HDL 79 05/30/2020   LDLCALC 73  05/30/2020   ALT 14 01/19/2020   AST 22 01/19/2020   NA 141 11/16/2019   K 4.6 11/16/2019   CL 104 11/16/2019   CREATININE 0.71 11/16/2019   BUN 11 11/16/2019   CO2 32 11/16/2019   TSH 1.90 11/16/2019    MM DIAG BREAST TOMO UNI RIGHT  Result Date: 09/19/2019 CLINICAL DATA:  Patient presents today recall from screening for a possible right breast asymmetry. EXAM: DIGITAL DIAGNOSTIC UNILATERAL RIGHT MAMMOGRAM WITH CAD AND TOMO COMPARISON:  Previous exam(s). ACR Breast Density Category d: The breast tissue is extremely dense, which lowers the sensitivity of mammography. FINDINGS: Full field tomosynthesis and spot compression views were obtained for the questioned asymmetry in the superior and far posterior right breast. On the additional imaging the tissue in this area disperses without underlying mass or distortion present. Mammographic images were processed with CAD. IMPRESSION: No mammographic evidence of malignancy in the right breast. RECOMMENDATION: Screening mammogram in one year.(Code:SM-B-01Y) I have discussed the findings and recommendations with the patient. If applicable, a reminder letter will be sent to the patient regarding the next appointment. BI-RADS CATEGORY  1: Negative. Electronically Signed   By: Emmaline Kluver M.D.   On: 09/19/2019 10:55    Assessment & Plan:   Sonda Primes, MD

## 2020-10-23 ENCOUNTER — Other Ambulatory Visit: Payer: Self-pay | Admitting: *Deleted

## 2020-10-23 NOTE — Telephone Encounter (Signed)
Annual exam scheduled on 11/29/20

## 2020-10-24 MED ORDER — ZOLPIDEM TARTRATE 5 MG PO TABS
ORAL_TABLET | ORAL | 2 refills | Status: DC
Start: 2020-10-24 — End: 2022-02-12

## 2020-11-27 ENCOUNTER — Other Ambulatory Visit: Payer: Self-pay | Admitting: Internal Medicine

## 2020-11-27 DIAGNOSIS — Z1231 Encounter for screening mammogram for malignant neoplasm of breast: Secondary | ICD-10-CM

## 2020-11-29 ENCOUNTER — Ambulatory Visit: Payer: Managed Care, Other (non HMO) | Admitting: Obstetrics and Gynecology

## 2020-11-29 ENCOUNTER — Encounter: Payer: Self-pay | Admitting: Obstetrics and Gynecology

## 2020-11-29 ENCOUNTER — Other Ambulatory Visit: Payer: Self-pay

## 2020-11-29 VITALS — BP 108/66 | HR 72 | Ht 67.0 in | Wt 119.0 lb

## 2020-11-29 DIAGNOSIS — M818 Other osteoporosis without current pathological fracture: Secondary | ICD-10-CM | POA: Diagnosis not present

## 2020-11-29 DIAGNOSIS — Z01419 Encounter for gynecological examination (general) (routine) without abnormal findings: Secondary | ICD-10-CM

## 2020-11-29 NOTE — Progress Notes (Signed)
   Carol Mata 1955/12/04 [id number]  SUBJECTIVE:  65 y.o. [id number] female for annual routine gynecologic exam. She has no gynecologic concerns.  Current Outpatient Medications  Medication Sig Dispense Refill  . b complex vitamins tablet Take 1 tablet by mouth daily. 100 tablet 3  . Cholecalciferol (VITAMIN D3) 50 MCG (2000 UT) capsule Take 1 capsule (2,000 Units total) by mouth daily. 100 capsule 3  . levothyroxine (SYNTHROID) 88 MCG tablet Take 1 tablet (88 mcg total) by mouth daily before breakfast. 90 tablet 3  . zolpidem (AMBIEN) 5 MG tablet TAKE ONE TABLET AT BEDTIME AS NEEDED FOR SLEEP 30 tablet 2  . aspirin EC 81 MG tablet Take 1 tablet (81 mg total) by mouth daily. 100 tablet 3  . fluticasone (FLONASE) 50 MCG/ACT nasal spray Place 2 sprays into both nostrils as needed. (Patient not taking: Reported on 11/29/2020) 16 g 4  . icosapent Ethyl (VASCEPA) 1 g capsule Take 2 capsules (2 g total) by mouth 2 (two) times daily. (Patient not taking: Reported on 11/29/2020) 120 capsule 11   No current facility-administered medications for this visit.   Allergies: Lactose intolerance (gi)  No LMP recorded. Patient is postmenopausal.  Past medical history,surgical history, problem list, medications, allergies, family history and social history were all reviewed and documented as reviewed in the EPIC chart.  ROS:     OBJECTIVE:  BP 108/66 (BP Location: Right Arm)   Pulse 72   Ht 5\' 7"  (1.702 m)   Wt 119 lb (54 kg)   BMI 18.64 kg/m  The patient appears well, alert, oriented, in no distress.  BREAST EXAM: breasts appear normal, no suspicious masses, no skin or nipple changes or axillary nodes  PELVIC EXAM: VULVA: normal appearing vulva with no masses, tenderness or lesions, atrophic changes noted. VAGINA: normal appearing vagina with atrophic changes, normal color and discharge, no lesions, CERVIX: normal appearing cervix without discharge or lesions, UTERUS: uterus is normal size, shape,  consistency and nontender, ADNEXA: normal adnexa in size, nontender and no masses  Chaperone: , RN present during the examination  ASSESSMENT:  65 y.o. [phone] here for annual gynecologic exam  PLAN:   1. Postmenopause. No gynecologic concerns.  No significant hot flashes or night sweats.  No vaginal bleeding. 2. Pap smear 10/2018 normal. Not repeated today. No recent history of abnormal Pap smears. Next Pap smear due 2023 following the current screening guidelines calling for the 3-year interval. 3. Mammogram 08/2019. Will continue with annual mammography. Knows she is overdue and will plan to schedule.  Breast exam normal today.   4. Colonoscopy 2020. Recommended that she continue per the prescribed interval.   5. Osteoporosis.  DEXA 09/2019 was stable with T score -1.7 compared to 2018.  She is taking a medication holiday from Fosamax for 3 years and will then repeat the DEXA after her next annual exam. 6. Health maintenance.  She has blood work done through her primary doctor.  Vitamin D level normalized 05/2020. The patient is aware that I will only be at this practice until early March 2022 so she knows to make sure she requests follow-up on results for any medical tests that she does when I am no longer at the practice.   Return annually or sooner, prn.  April 2022 MD  11/29/20

## 2020-12-27 DIAGNOSIS — Z1231 Encounter for screening mammogram for malignant neoplasm of breast: Secondary | ICD-10-CM

## 2021-01-18 DIAGNOSIS — E538 Deficiency of other specified B group vitamins: Secondary | ICD-10-CM

## 2021-01-18 DIAGNOSIS — E559 Vitamin D deficiency, unspecified: Secondary | ICD-10-CM

## 2021-01-18 NOTE — Telephone Encounter (Signed)
Made appt for lab on Monday.Marland KitchenRaechel Chute

## 2021-01-21 ENCOUNTER — Other Ambulatory Visit: Payer: Self-pay

## 2021-01-21 ENCOUNTER — Other Ambulatory Visit (INDEPENDENT_AMBULATORY_CARE_PROVIDER_SITE_OTHER): Payer: Managed Care, Other (non HMO)

## 2021-01-21 DIAGNOSIS — Z0000 Encounter for general adult medical examination without abnormal findings: Secondary | ICD-10-CM | POA: Diagnosis not present

## 2021-01-21 DIAGNOSIS — E559 Vitamin D deficiency, unspecified: Secondary | ICD-10-CM | POA: Diagnosis not present

## 2021-01-21 LAB — LIPID PANEL
Cholesterol: 194 mg/dL (ref 0–200)
HDL: 80 mg/dL (ref >39.00)
LDL Cholesterol: 98 mg/dL (ref 0–99)
NonHDL: 113.76
Total CHOL/HDL Ratio: 2
Triglycerides: 78 mg/dL (ref 0.0–149.0)
VLDL: 15.6 mg/dL (ref 0.0–40.0)

## 2021-01-21 LAB — VITAMIN D 25 HYDROXY (VIT D DEFICIENCY, FRACTURES): VITD: 37.3 ng/mL (ref 30.00–100.00)

## 2021-01-21 LAB — VITAMIN B12: Vitamin B-12: 353 pg/mL (ref 211–911)

## 2021-01-21 NOTE — Telephone Encounter (Signed)
Patient calling with questions regarding lab draw from today. Patient  Wants to be sure all labs were drawn, she was told in the lab there were no order FOR b12 or lipids. Orders seen in Epic  Patient requesting call back from [facility]

## 2021-01-23 ENCOUNTER — Other Ambulatory Visit: Payer: Self-pay

## 2021-01-24 ENCOUNTER — Ambulatory Visit (INDEPENDENT_AMBULATORY_CARE_PROVIDER_SITE_OTHER): Payer: Managed Care, Other (non HMO) | Admitting: Internal Medicine

## 2021-01-24 ENCOUNTER — Other Ambulatory Visit: Payer: Self-pay

## 2021-01-24 ENCOUNTER — Encounter: Payer: Self-pay | Admitting: Internal Medicine

## 2021-01-24 VITALS — BP 110/68 | HR 74 | Temp 98.7°F | Ht 67.0 in | Wt 117.0 lb

## 2021-01-24 DIAGNOSIS — E559 Vitamin D deficiency, unspecified: Secondary | ICD-10-CM

## 2021-01-24 DIAGNOSIS — Z0000 Encounter for general adult medical examination without abnormal findings: Secondary | ICD-10-CM | POA: Diagnosis not present

## 2021-01-24 DIAGNOSIS — I2510 Atherosclerotic heart disease of native coronary artery without angina pectoris: Secondary | ICD-10-CM

## 2021-01-24 DIAGNOSIS — E039 Hypothyroidism, unspecified: Secondary | ICD-10-CM

## 2021-01-24 DIAGNOSIS — E785 Hyperlipidemia, unspecified: Secondary | ICD-10-CM

## 2021-01-24 DIAGNOSIS — I2583 Coronary atherosclerosis due to lipid rich plaque: Secondary | ICD-10-CM

## 2021-01-24 MED ORDER — RIZATRIPTAN BENZOATE 10 MG PO TABS: 10.0000 mg | ORAL_TABLET | Freq: Once | ORAL | 12 refills | Status: DC | PRN

## 2021-01-24 MED ORDER — ICOSAPENT ETHYL 1 G PO CAPS: 2.0000 g | ORAL_CAPSULE | Freq: Two times a day (BID) | ORAL | 3 refills | Status: DC

## 2021-01-24 MED ORDER — LEVOTHYROXINE SODIUM 88 MCG PO TABS: 88.0000 ug | ORAL_TABLET | Freq: Every day | ORAL | 3 refills | Status: DC

## 2021-01-24 NOTE — Assessment & Plan Note (Signed)
On Vit D 

## 2021-01-24 NOTE — Patient Instructions (Addendum)
Florastor probiotic  Maxalt for migraines

## 2021-01-24 NOTE — Progress Notes (Signed)
Subjective:  Patient ID: Carol Mata, female    DOB: June 05, 1956  Age: 65 y.o. MRN: [id number]  CC: Annual Exam   HPI Carol Mata presents for a well exam  Outpatient Medications Prior to Visit  Medication Sig Dispense Refill  . b complex vitamins tablet Take 1 tablet by mouth daily. 100 tablet 3  . Cholecalciferol (VITAMIN D3) 50 MCG (2000 UT) capsule Take 1 capsule (2,000 Units total) by mouth daily. 100 capsule 3  . fluticasone (FLONASE) 50 MCG/ACT nasal spray Place 2 sprays into both nostrils as needed. 16 g 4  . icosapent Ethyl (VASCEPA) 1 g capsule Take 2 capsules (2 g total) by mouth 2 (two) times daily. 120 capsule 11  . levothyroxine (SYNTHROID) 88 MCG tablet Take 1 tablet (88 mcg total) by mouth daily before breakfast. 90 tablet 3  . zolpidem (AMBIEN) 5 MG tablet TAKE ONE TABLET AT BEDTIME AS NEEDED FOR SLEEP 30 tablet 2   No facility-administered medications prior to visit.    ROS: Review of Systems  Constitutional: Negative for activity change, appetite change, chills, fatigue and unexpected weight change.  HENT: Negative for congestion, mouth sores and sinus pressure.   Eyes: Negative for visual disturbance.  Respiratory: Negative for cough and chest tightness.   Gastrointestinal: Negative for abdominal pain and nausea.  Genitourinary: Negative for difficulty urinating, frequency and vaginal pain.  Musculoskeletal: Negative for back pain and gait problem.  Skin: Negative for pallor and rash.  Neurological: Negative for dizziness, tremors, weakness, numbness and headaches.  Psychiatric/Behavioral: Negative for confusion and sleep disturbance.    Objective:  There were no vitals taken for this visit.  BP Readings from Last 3 Encounters:  11/29/20 108/66  07/23/20 110/68  01/19/20 120/72    Wt Readings from Last 3 Encounters:  11/29/20 119 lb (54 kg)  07/23/20 116 lb (52.6 kg)  01/19/20 118 lb (53.5 kg)    Physical Exam Constitutional:       General: She is not in acute distress.    Appearance: She is well-developed.  HENT:     Head: Normocephalic.     Right Ear: External ear normal.     Left Ear: External ear normal.     Nose: Nose normal.  Eyes:     General:        Right eye: No discharge.        Left eye: No discharge.     Conjunctiva/sclera: Conjunctivae normal.     Pupils: Pupils are equal, round, and reactive to light.  Neck:     Thyroid: No thyromegaly.     Vascular: No JVD.     Trachea: No tracheal deviation.  Cardiovascular:     Rate and Rhythm: Normal rate and regular rhythm.     Heart sounds: Normal heart sounds.  Pulmonary:     Effort: No respiratory distress.     Breath sounds: No stridor. No wheezing.  Abdominal:     General: Bowel sounds are normal. There is no distension.     Palpations: Abdomen is soft. There is no mass.     Tenderness: There is no abdominal tenderness. There is no guarding or rebound.  Musculoskeletal:        General: No tenderness.     Cervical back: Normal range of motion and neck supple.  Lymphadenopathy:     Cervical: No cervical adenopathy.  Skin:    Findings: No erythema or rash.  Neurological:     Cranial Nerves: No  cranial nerve deficit.     Motor: No abnormal muscle tone.     Coordination: Coordination normal.     Deep Tendon Reflexes: Reflexes normal.  Psychiatric:        Behavior: Behavior normal.        Thought Content: Thought content normal.        Judgment: Judgment normal.     Lab Results  Component Value Date   WBC 3.9 11/16/2019   HGB 13.1 11/16/2019   HCT 38.2 11/16/2019   PLT 176 11/16/2019   GLUCOSE 83 11/16/2019   CHOL 194 01/21/2021   TRIG 78.0 01/21/2021   HDL 80.00 01/21/2021   LDLCALC 98 01/21/2021   ALT 14 01/19/2020   AST 22 01/19/2020   NA 141 11/16/2019   K 4.6 11/16/2019   CL 104 11/16/2019   CREATININE 0.71 11/16/2019   BUN 11 11/16/2019   CO2 32 11/16/2019   TSH 1.90 11/16/2019    MM DIAG BREAST TOMO UNI RIGHT  Result  Date: 09/19/2019 CLINICAL DATA:  Patient presents today recall from screening for a possible right breast asymmetry. EXAM: DIGITAL DIAGNOSTIC UNILATERAL RIGHT MAMMOGRAM WITH CAD AND TOMO COMPARISON:  Previous exam(s). ACR Breast Density Category d: The breast tissue is extremely dense, which lowers the sensitivity of mammography. FINDINGS: Full field tomosynthesis and spot compression views were obtained for the questioned asymmetry in the superior and far posterior right breast. On the additional imaging the tissue in this area disperses without underlying mass or distortion present. Mammographic images were processed with CAD. IMPRESSION: No mammographic evidence of malignancy in the right breast. RECOMMENDATION: Screening mammogram in one year.(Code:SM-B-01Y) I have discussed the findings and recommendations with the patient. If applicable, a reminder letter will be sent to the patient regarding the next appointment. BI-RADS CATEGORY  1: Negative. Electronically Signed   By: Emmaline Kluver M.D.   On: 09/19/2019 10:55    Assessment & Plan:   There are no diagnoses linked to this encounter.   No orders of the defined types were placed in this encounter.    Follow-up: No follow-ups on file.  Sonda Primes, MD

## 2021-01-24 NOTE — Assessment & Plan Note (Signed)
On Vascepa - try to take 2-4 a day

## 2021-01-24 NOTE — Assessment & Plan Note (Signed)
On Levothroid 

## 2021-01-24 NOTE — Assessment & Plan Note (Signed)

## 2021-02-13 ENCOUNTER — Ambulatory Visit
Admission: RE | Admit: 2021-02-13 | Discharge: 2021-02-13 | Disposition: A | Discharge status: Home / Self Care | Payer: Managed Care, Other (non HMO) | Source: Ambulatory Visit | Attending: Internal Medicine | Admitting: Internal Medicine

## 2021-02-13 ENCOUNTER — Other Ambulatory Visit: Payer: Self-pay

## 2021-02-13 DIAGNOSIS — Z1231 Encounter for screening mammogram for malignant neoplasm of breast: Secondary | ICD-10-CM

## 2021-03-22 ENCOUNTER — Telehealth (INDEPENDENT_AMBULATORY_CARE_PROVIDER_SITE_OTHER): Payer: Managed Care, Other (non HMO) | Admitting: Internal Medicine

## 2021-03-22 ENCOUNTER — Encounter: Payer: Self-pay | Admitting: Internal Medicine

## 2021-03-22 ENCOUNTER — Other Ambulatory Visit: Payer: Self-pay

## 2021-03-22 DIAGNOSIS — U071 COVID-19: Secondary | ICD-10-CM | POA: Diagnosis not present

## 2021-03-22 DIAGNOSIS — J0100 Acute maxillary sinusitis, unspecified: Secondary | ICD-10-CM | POA: Diagnosis not present

## 2021-03-22 MED ORDER — AMOXICILLIN 875 MG PO TABS: 875.0000 mg | ORAL_TABLET | Freq: Two times a day (BID) | ORAL | 0 refills | Status: AC

## 2021-03-22 NOTE — Assessment & Plan Note (Signed)
Discussed OTC meds 

## 2021-03-22 NOTE — Progress Notes (Signed)
Virtual Visit via Video Note  I connected with Carol Mata on 03/22/21 at 10:20 AM EDT by a video enabled telemedicine application and verified that I am speaking with the correct person using two identifiers.   I discussed the limitations of evaluation and management by telemedicine and the availability of in person appointments. The patient expressed understanding and agreed to proceed.  I was located at home office. The patient was at home. Brett Canales is present in the visit.   History of Present Illness: Carol Mata has been sick with cold-like symptoms for 4 days.  She tested positive for COVID yesterday.  She is complaining of dry cough, sore throat, low-grade fever.  She thinks she is developing a sinus infection with sinus pain, gum pain.  No colored discharge Observations/Objective: The patient appears to be in no acute distress, looks well.  Carol Mata is coughing  Assessment and Plan:  See my Assessment and Plan. Follow Up Instructions:    I discussed the assessment and treatment plan with the patient. The patient was provided an opportunity to ask questions and all were answered. The patient agreed with the plan and demonstrated an understanding of the instructions.   The patient was advised to call back or seek an in-person evaluation if the symptoms worsen or if the condition fails to improve as anticipated.  I provided face-to-face time during this encounter. We were at different locations.   Sonda Primes, MD

## 2021-03-22 NOTE — Assessment & Plan Note (Signed)
Amoxicillin Rx if worse

## 2021-07-22 DIAGNOSIS — E785 Hyperlipidemia, unspecified: Secondary | ICD-10-CM

## 2021-07-22 DIAGNOSIS — Z0000 Encounter for general adult medical examination without abnormal findings: Secondary | ICD-10-CM

## 2021-07-22 DIAGNOSIS — E538 Deficiency of other specified B group vitamins: Secondary | ICD-10-CM

## 2021-07-22 DIAGNOSIS — E039 Hypothyroidism, unspecified: Secondary | ICD-10-CM

## 2021-07-22 DIAGNOSIS — E559 Vitamin D deficiency, unspecified: Secondary | ICD-10-CM

## 2021-07-30 ENCOUNTER — Other Ambulatory Visit: Payer: Managed Care, Other (non HMO)

## 2021-08-05 ENCOUNTER — Other Ambulatory Visit (INDEPENDENT_AMBULATORY_CARE_PROVIDER_SITE_OTHER): Payer: Managed Care, Other (non HMO)

## 2021-08-05 DIAGNOSIS — E039 Hypothyroidism, unspecified: Secondary | ICD-10-CM | POA: Diagnosis not present

## 2021-08-05 DIAGNOSIS — E785 Hyperlipidemia, unspecified: Secondary | ICD-10-CM

## 2021-08-05 DIAGNOSIS — E559 Vitamin D deficiency, unspecified: Secondary | ICD-10-CM

## 2021-08-05 DIAGNOSIS — Z0000 Encounter for general adult medical examination without abnormal findings: Secondary | ICD-10-CM | POA: Diagnosis not present

## 2021-08-05 LAB — URINALYSIS, ROUTINE W REFLEX MICROSCOPIC
Bilirubin Urine: NEGATIVE
Ketones, ur: NEGATIVE
Nitrite: NEGATIVE
Specific Gravity, Urine: 1.025 (ref 1.000–1.030)
Total Protein, Urine: NEGATIVE
Urine Glucose: NEGATIVE
Urobilinogen, UA: 0.2 (ref 0.0–1.0)
pH: 6 (ref 5.0–8.0)

## 2021-08-05 LAB — CBC WITH DIFFERENTIAL/PLATELET
Basophils Absolute: 0 10*3/uL (ref 0.0–0.1)
Basophils Relative: 0.6 % (ref 0.0–3.0)
Eosinophils Absolute: 0 10*3/uL (ref 0.0–0.7)
Eosinophils Relative: 0.9 % (ref 0.0–5.0)
HCT: 37.3 % (ref 36.0–46.0)
Hemoglobin: 13 g/dL (ref 12.0–15.0)
Lymphocytes Relative: 28.6 % (ref 12.0–46.0)
Lymphs Abs: 1.3 10*3/uL (ref 0.7–4.0)
MCHC: 34.7 g/dL (ref 30.0–36.0)
MCV: 88 fl (ref 78.0–100.0)
Monocytes Absolute: 0.5 10*3/uL (ref 0.1–1.0)
Monocytes Relative: 9.7 % (ref 3.0–12.0)
Neutro Abs: 2.8 10*3/uL (ref 1.4–7.7)
Neutrophils Relative %: 60.2 % (ref 43.0–77.0)
Platelets: 175 10*3/uL (ref 150.0–400.0)
RBC: 4.24 Mil/uL (ref 3.87–5.11)
RDW: 12.6 % (ref 11.5–15.5)
WBC: 4.7 10*3/uL (ref 4.0–10.5)

## 2021-08-05 LAB — BASIC METABOLIC PANEL
BUN: 15 mg/dL (ref 6–23)
CO2: 31 mEq/L (ref 19–32)
Calcium: 9.4 mg/dL (ref 8.4–10.5)
Chloride: 103 mEq/L (ref 96–112)
Creatinine, Ser: 0.72 mg/dL (ref 0.40–1.20)
GFR: 88.27 mL/min (ref >60.00)
Glucose, Bld: 69 mg/dL — ABNORMAL LOW (ref 70–99)
Potassium: 4.2 mEq/L (ref 3.5–5.1)
Sodium: 141 mEq/L (ref 135–145)

## 2021-08-05 LAB — LIPID PANEL
Cholesterol: 189 mg/dL (ref 0–200)
HDL: 84.7 mg/dL (ref >39.00)
LDL Cholesterol: 91 mg/dL (ref 0–99)
NonHDL: 104.14
Total CHOL/HDL Ratio: 2
Triglycerides: 65 mg/dL (ref 0.0–149.0)
VLDL: 13 mg/dL (ref 0.0–40.0)

## 2021-08-05 LAB — TSH: TSH: 1.18 u[IU]/mL (ref 0.35–5.50)

## 2021-08-05 LAB — HEPATIC FUNCTION PANEL
ALT: 12 U/L (ref 0–35)
AST: 21 U/L (ref 0–37)
Albumin: 4.4 g/dL (ref 3.5–5.2)
Alkaline Phosphatase: 58 U/L (ref 39–117)
Bilirubin, Direct: 0.1 mg/dL (ref 0.0–0.3)
Total Bilirubin: 0.7 mg/dL (ref 0.2–1.2)
Total Protein: 6.9 g/dL (ref 6.0–8.3)

## 2021-08-05 LAB — VITAMIN D 25 HYDROXY (VIT D DEFICIENCY, FRACTURES): VITD: 39.31 ng/mL (ref 30.00–100.00)

## 2021-08-05 NOTE — Telephone Encounter (Signed)
PT CAME THIS AM FOR LABS. CLOSING ENCOUNTER.Marland Kitchen/LMB

## 2021-08-06 ENCOUNTER — Ambulatory Visit: Payer: Managed Care, Other (non HMO) | Admitting: Internal Medicine

## 2021-08-08 ENCOUNTER — Encounter: Payer: Self-pay | Admitting: Internal Medicine

## 2021-08-08 ENCOUNTER — Ambulatory Visit: Payer: Managed Care, Other (non HMO) | Admitting: Internal Medicine

## 2021-08-08 ENCOUNTER — Other Ambulatory Visit: Payer: Self-pay

## 2021-08-08 VITALS — BP 112/71 | HR 67 | Temp 98.3°F | Ht 67.0 in | Wt 115.6 lb

## 2021-08-08 DIAGNOSIS — I2510 Atherosclerotic heart disease of native coronary artery without angina pectoris: Secondary | ICD-10-CM

## 2021-08-08 DIAGNOSIS — Z0000 Encounter for general adult medical examination without abnormal findings: Secondary | ICD-10-CM

## 2021-08-08 DIAGNOSIS — R251 Tremor, unspecified: Secondary | ICD-10-CM

## 2021-08-08 DIAGNOSIS — E039 Hypothyroidism, unspecified: Secondary | ICD-10-CM | POA: Diagnosis not present

## 2021-08-08 DIAGNOSIS — I2583 Coronary atherosclerosis due to lipid rich plaque: Secondary | ICD-10-CM

## 2021-08-08 DIAGNOSIS — E559 Vitamin D deficiency, unspecified: Secondary | ICD-10-CM

## 2021-08-08 NOTE — Assessment & Plan Note (Addendum)
New.  It is mild at present.  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  Has been Carol Mata noted some head tremor lately.  I do not see any parkinsonian traits at present.  Treatment options were discussed. I suggested a neurology consultation.

## 2021-08-08 NOTE — Assessment & Plan Note (Addendum)
On Vascepa and aspirin

## 2021-08-08 NOTE — Assessment & Plan Note (Signed)
On Vit D 

## 2021-08-08 NOTE — Progress Notes (Signed)
Subjective:  Patient ID: Carol Mata, female    DOB: 1955-12-14  Age: 65 y.o. MRN: [id number]  CC: No chief complaint on file.   HPI Carol Mata presents for B hand tremor x 1 year. C/o head shaking Follow-up on dyslipidemia, coronary atherosclerosis.  Outpatient Medications Prior to Visit  Medication Sig Dispense Refill   b complex vitamins tablet Take 1 tablet by mouth daily. 100 tablet 3   Cholecalciferol (VITAMIN D3) 50 MCG (2000 UT) capsule Take 1 capsule (2,000 Units total) by mouth daily. 100 capsule 3   fluticasone (FLONASE) 50 MCG/ACT nasal spray Place 2 sprays into both nostrils as needed. 16 g 4   icosapent Ethyl (VASCEPA) 1 g capsule Take 2 capsules (2 g total) by mouth 2 (two) times daily. 360 capsule 3   levothyroxine (SYNTHROID) 88 MCG tablet Take 1 tablet (88 mcg total) by mouth daily before breakfast. 90 tablet 3   rizatriptan (MAXALT) 10 MG tablet Take 1 tablet (10 mg total) by mouth once as needed for up to 1 dose for migraine. May repeat in 2 hours if needed 12 tablet 12   zolpidem (AMBIEN) 5 MG tablet TAKE ONE TABLET AT BEDTIME AS NEEDED FOR SLEEP 30 tablet 2   No facility-administered medications prior to visit.    ROS: Review of Systems  Constitutional:  Negative for activity change, appetite change, chills, fatigue and unexpected weight change.  HENT:  Negative for congestion, mouth sores and sinus pressure.   Eyes:  Negative for visual disturbance.  Respiratory:  Negative for cough and chest tightness.   Gastrointestinal:  Negative for abdominal pain and nausea.  Genitourinary:  Negative for difficulty urinating, frequency and vaginal pain.  Musculoskeletal:  Negative for back pain and gait problem.  Skin:  Negative for pallor and rash.  Neurological:  Negative for dizziness, tremors, weakness, numbness and headaches.  Psychiatric/Behavioral:  Negative for confusion and sleep disturbance.    Objective:  BP 112/71 (BP Location: Left Arm)    Pulse 67   Temp 98.3 F (36.8 C) (Oral)   Ht 5\' 7"  (1.702 m)   Wt 115 lb 9.6 oz (52.4 kg)   SpO2 98%   BMI 18.11 kg/m   BP Readings from Last 3 Encounters:  08/08/21 112/71  01/24/21 110/68  11/29/20 108/66    Wt Readings from Last 3 Encounters:  08/08/21 115 lb 9.6 oz (52.4 kg)  01/24/21 117 lb (53.1 kg)  11/29/20 119 lb (54 kg)    Physical Exam Constitutional:      General: She is not in acute distress.    Appearance: She is well-developed.  HENT:     Head: Normocephalic.     Right Ear: External ear normal.     Left Ear: External ear normal.     Nose: Nose normal.  Eyes:     General:        Right eye: No discharge.        Left eye: No discharge.     Conjunctiva/sclera: Conjunctivae normal.     Pupils: Pupils are equal, round, and reactive to light.  Neck:     Thyroid: No thyromegaly.     Vascular: No JVD.     Trachea: No tracheal deviation.  Cardiovascular:     Rate and Rhythm: Normal rate and regular rhythm.     Heart sounds: Normal heart sounds.  Pulmonary:     Effort: No respiratory distress.     Breath sounds: No stridor. No wheezing.  Abdominal:     General: Bowel sounds are normal. There is no distension.     Palpations: Abdomen is soft. There is no mass.     Tenderness: There is no abdominal tenderness. There is no guarding or rebound.  Musculoskeletal:        General: No tenderness.     Cervical back: Normal range of motion and neck supple. No rigidity.  Lymphadenopathy:     Cervical: No cervical adenopathy.  Skin:    Findings: No erythema or rash.  Neurological:     Mental Status: She is oriented to person, place, and time.     Cranial Nerves: No cranial nerve deficit.     Motor: No abnormal muscle tone.     Coordination: Coordination normal.     Deep Tendon Reflexes: Reflexes normal.  Psychiatric:        Behavior: Behavior normal.        Thought Content: Thought content normal.        Judgment: Judgment normal.  Neuro mild tremor and  stretch fingers is present.  I do not appreciate any parkinsonian traits today.  Good balance  Lab Results  Component Value Date   WBC 4.7 08/05/2021   HGB 13.0 08/05/2021   HCT 37.3 08/05/2021   PLT 175.0 08/05/2021   GLUCOSE 69 (L) 08/05/2021   CHOL 189 08/05/2021   TRIG 65.0 08/05/2021   HDL 84.70 08/05/2021   LDLCALC 91 08/05/2021   ALT 12 08/05/2021   AST 21 08/05/2021   NA 141 08/05/2021   K 4.2 08/05/2021   CL 103 08/05/2021   CREATININE 0.72 08/05/2021   BUN 15 08/05/2021   CO2 31 08/05/2021   TSH 1.18 08/05/2021    MM 3D SCREEN BREAST BILATERAL  Result Date: 02/13/2021 CLINICAL DATA:  Screening. EXAM: DIGITAL SCREENING BILATERAL MAMMOGRAM WITH TOMOSYNTHESIS AND CAD TECHNIQUE: Bilateral screening digital craniocaudal and mediolateral oblique mammograms were obtained. Bilateral screening digital breast tomosynthesis was performed. The images were evaluated with computer-aided detection. COMPARISON:  Previous exam(s). ACR Breast Density Category d: The breast tissue is extremely dense, which lowers the sensitivity of mammography FINDINGS: There are no findings suspicious for malignancy. The images were evaluated with computer-aided detection. IMPRESSION: No mammographic evidence of malignancy. A result letter of this screening mammogram will be mailed directly to the patient. RECOMMENDATION: Screening mammogram in one year. (Code:SM-B-01Y) BI-RADS CATEGORY  1: Negative. Electronically Signed   By: Frederico Hamman M.D.   On: 02/13/2021 08:34    Assessment & Plan:   Problem List Items Addressed This Visit     Coronary atherosclerosis    On Vascepa and aspirin      Hypothyroidism    On Levothyroxine      Tremor    New.  It is mild at present.  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  Has been Brett Canales noted some head tremor lately.  I do not see any parkinsonian traits at present.  Treatment options were discussed. I suggested a neurology consultation.        Vitamin D deficiency    On Vit D      Well adult exam - Primary   Relevant Orders   TSH   Urinalysis   CBC with Differential/Platelet   Lipid panel   Comprehensive metabolic panel      Follow-up: Return in about 6 months (around 02/06/2022) for Wellness Exam.  Sonda Primes, MD

## 2021-08-08 NOTE — Patient Instructions (Signed)
  Martha Stout "[facility]"    

## 2021-08-08 NOTE — Assessment & Plan Note (Addendum)
On Levothyroxine.  We will continue

## 2021-08-11 ENCOUNTER — Encounter: Payer: Self-pay | Admitting: Internal Medicine

## 2021-11-13 IMAGING — MG MM DIGITAL SCREENING BILAT W/ TOMO AND CAD
8 series · 9 of 24 positions shown · non-contrast
Comparison: Previous exam(s).

CLINICAL DATA: Screening.

EXAM:
DIGITAL SCREENING BILATERAL MAMMOGRAM WITH TOMOSYNTHESIS AND CAD
TECHNIQUE: Bilateral screening digital craniocaudal and mediolateral oblique
mammograms were obtained. Bilateral screening digital breast
tomosynthesis was performed. The images were evaluated with
computer-aided detection.

[L CC synth-2D]
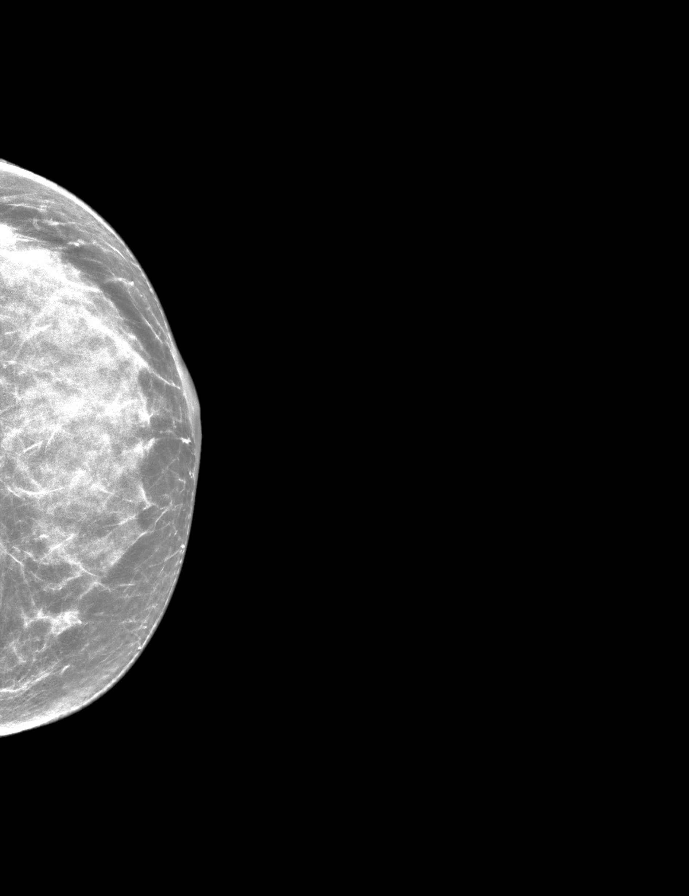

[R MLO synth-2D]
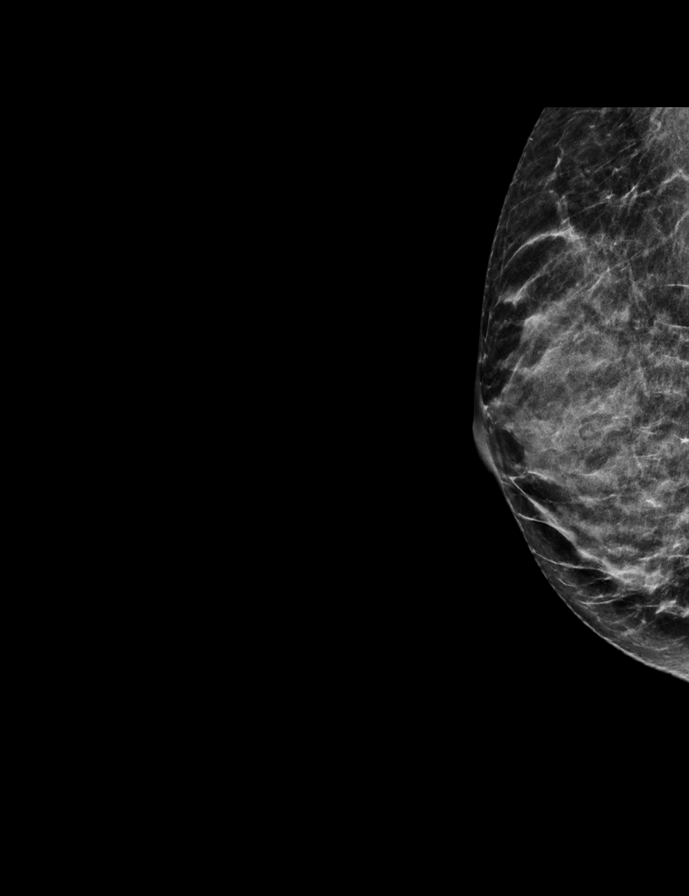

[L MLO synth-2D]
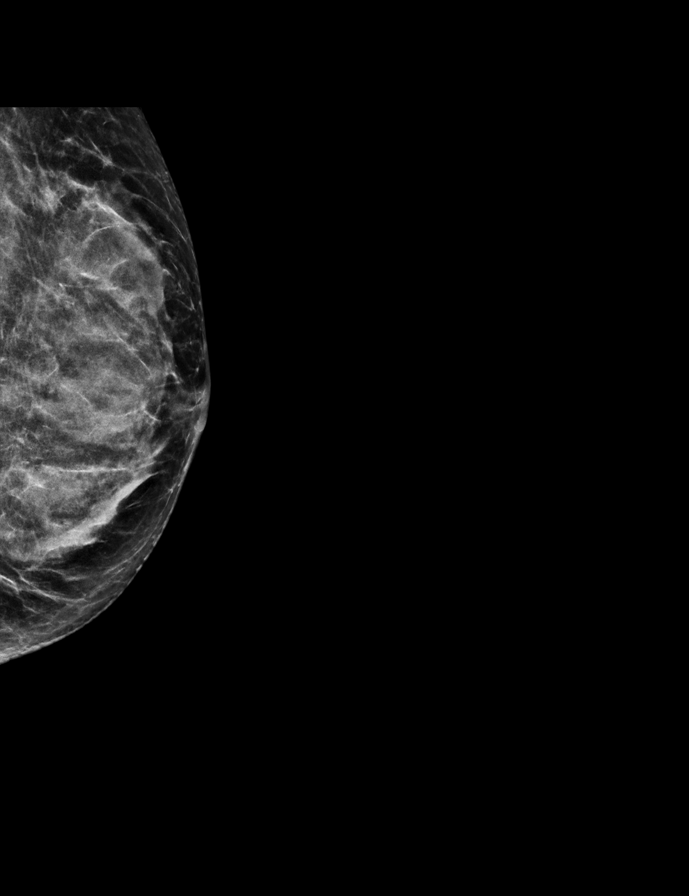

[R CC synth-2D]
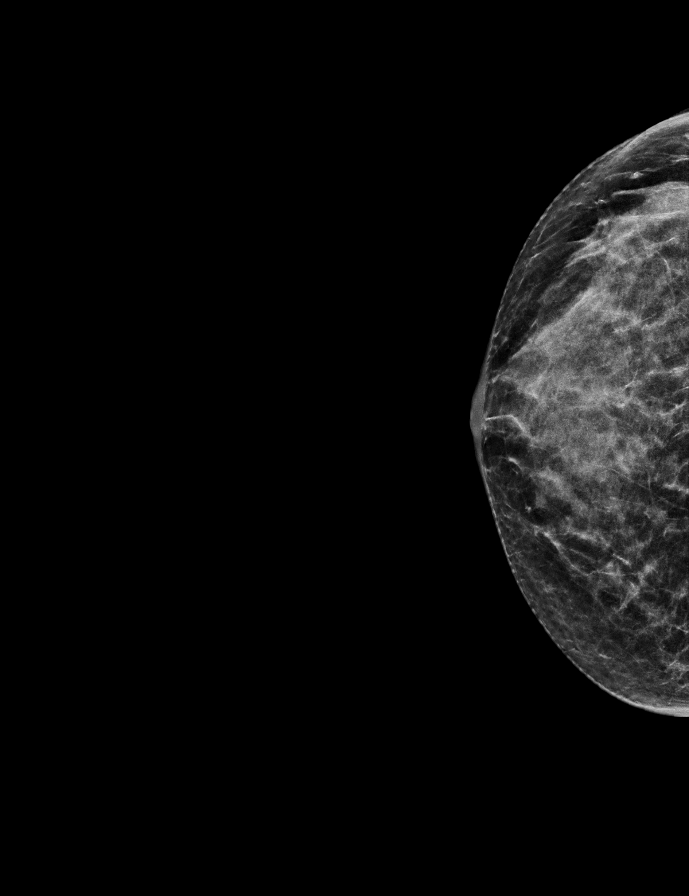

[R CC tomo · 2 of 47 frames shown]
[frame 16/47]
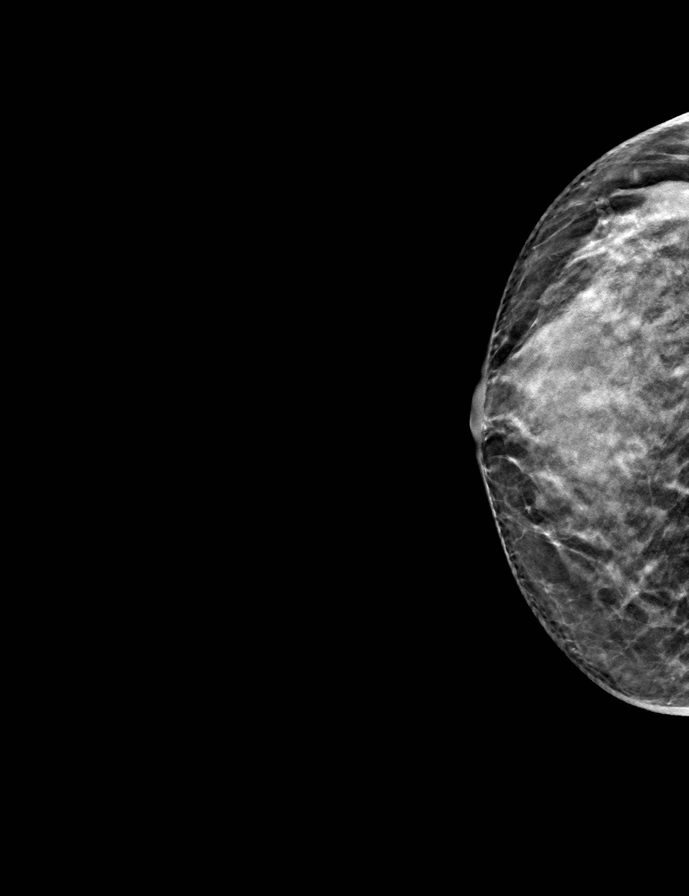
[frame 24/47]
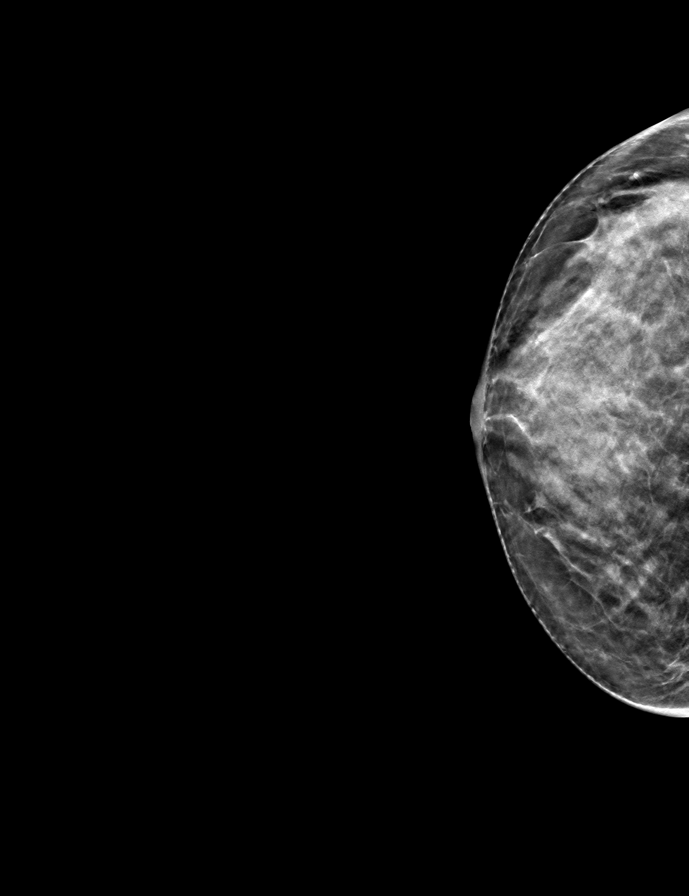

[R MLO tomo · tomo slice 23/45.0]
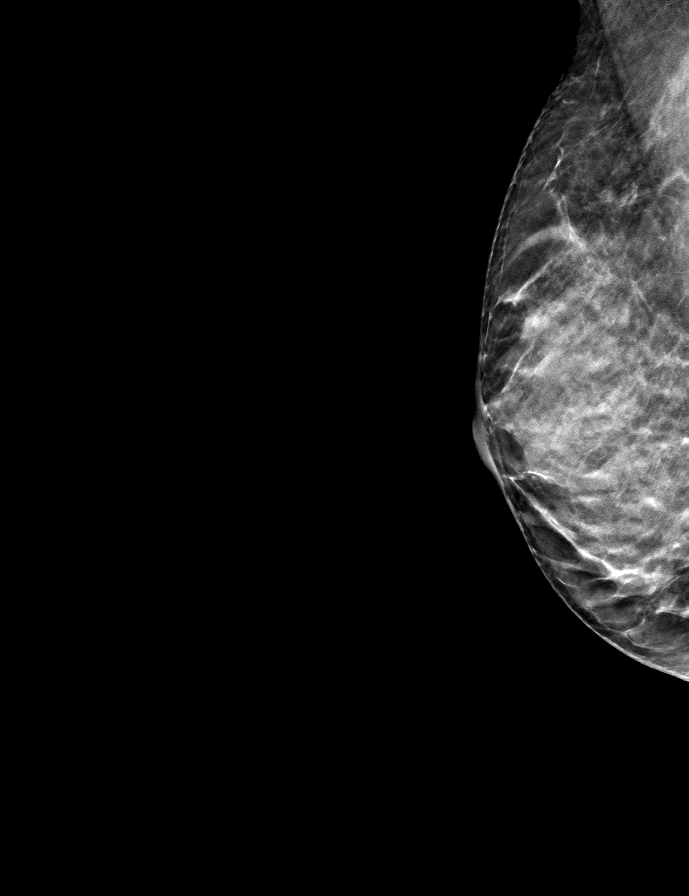

[L MLO tomo · tomo slice 25/49.0]
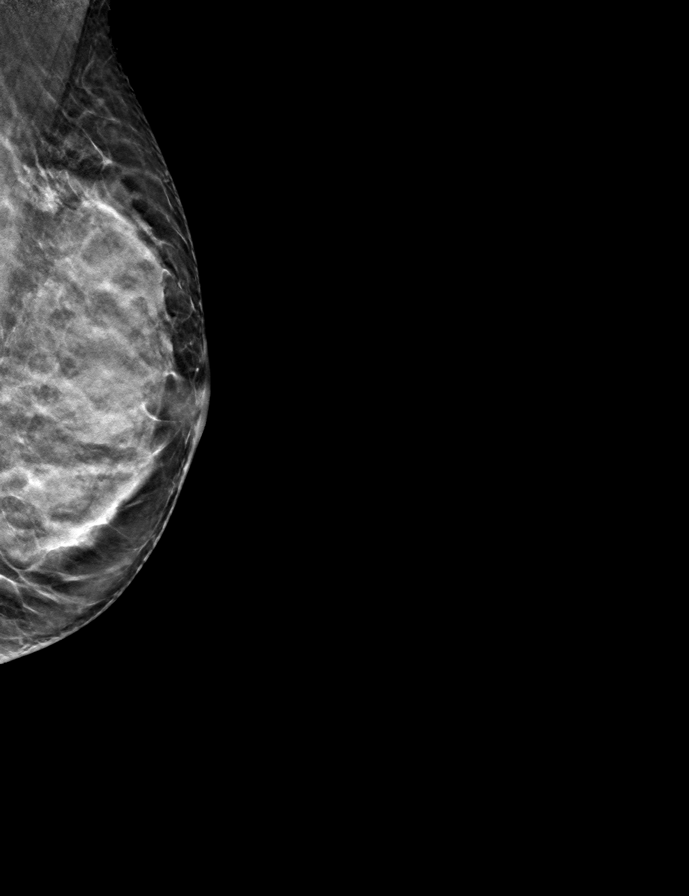

[L CC tomo · tomo slice 27/53.0]
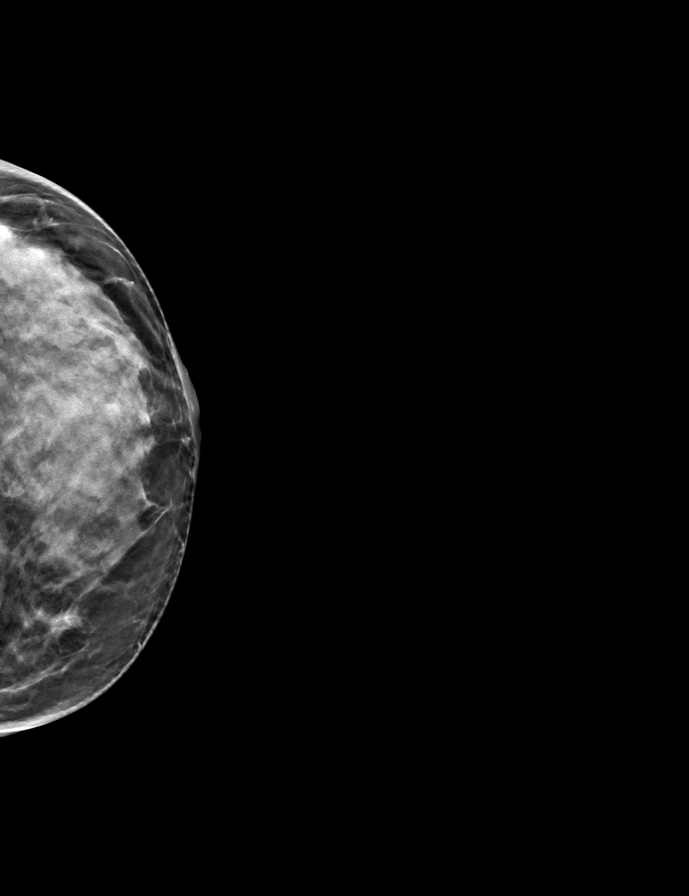

[9 of 24 positions shown; findings below may reference images not displayed]

ACR Breast Density Category d: The breast tissue is extremely dense,
which lowers the sensitivity of mammography
FINDINGS: There are no findings suspicious for malignancy. The images were
evaluated with computer-aided detection.
IMPRESSION: No mammographic evidence of malignancy. A result letter of this
screening mammogram will be mailed directly to the patient.

RECOMMENDATION:
Screening mammogram in one year. (Code:95-0-E9V)

BI-RADS CATEGORY  1: Negative.

## 2021-11-29 NOTE — Progress Notes (Signed)
ttLast pap- Jan 2020- normal Last mammo- April 2022- normal

## 2021-12-02 ENCOUNTER — Other Ambulatory Visit: Payer: Self-pay

## 2021-12-02 ENCOUNTER — Encounter: Payer: Self-pay | Admitting: Obstetrics & Gynecology

## 2021-12-02 ENCOUNTER — Ambulatory Visit (INDEPENDENT_AMBULATORY_CARE_PROVIDER_SITE_OTHER): Payer: Managed Care, Other (non HMO) | Admitting: Obstetrics & Gynecology

## 2021-12-02 ENCOUNTER — Other Ambulatory Visit (HOSPITAL_COMMUNITY)
Admission: RE | Admit: 2021-12-02 | Discharge: 2021-12-02 | Disposition: A | Discharge status: Home / Self Care | Payer: Managed Care, Other (non HMO) | Source: Ambulatory Visit | Attending: Obstetrics & Gynecology | Admitting: Obstetrics & Gynecology

## 2021-12-02 VITALS — BP 126/74 | HR 68 | Ht 67.0 in | Wt 114.0 lb

## 2021-12-02 DIAGNOSIS — M8580 Other specified disorders of bone density and structure, unspecified site: Secondary | ICD-10-CM

## 2021-12-02 DIAGNOSIS — R197 Diarrhea, unspecified: Secondary | ICD-10-CM | POA: Diagnosis not present

## 2021-12-02 DIAGNOSIS — Z01419 Encounter for gynecological examination (general) (routine) without abnormal findings: Secondary | ICD-10-CM | POA: Insufficient documentation

## 2021-12-02 NOTE — Progress Notes (Signed)
Subjective:     Carol Mata is a 66 y.o. female here for a routine exam.  Current complaints: Lactose intolerance; controlled with diet.  Carol Mata does not believe she has ever been tested for celiac disease.  Those tests ordered today.  Patient did have osteoporosis and with treatment she returned to osteopenia.  It has been several years since she had a DEXA scan.  Patient is unable to take calcium supplements due to stomach upset.  She does take vitamin D supplements.  Her primary care doctor manages her vitamin D deficiency, colon screening.  Carol Mata is up-to-date.    Gynecologic History No LMP recorded. Patient is postmenopausal. Contraception: post menopausal status Last Pap: 2020. Results were: normal Last mammogram: April 2022. Results were: normal  Obstetric History OB History  Gravida Para Term Preterm AB Living  4 3 3   1 3   SAB IAB Ectopic Multiple Live Births  1            # Outcome Date GA Lbr Len/2nd Weight Sex Delivery Anes PTL Lv  4 SAB           3 Term           2 Term           1 Term              The following portions of the patient's history were reviewed and updated as appropriate: allergies, current medications, past family history, past medical history, past social history, past surgical history, and problem list.  Review of Systems Pertinent items noted in HPI and remainder of comprehensive ROS otherwise negative.    Objective:     Vitals:   12/02/21 0825  BP: 126/74  Pulse: 68  Weight: 114 lb (51.7 kg)  Height: 5\' 7"  (1.702 m)   Vitals:  WNL General appearance: alert, cooperative and no distress  HEENT: Normocephalic, without obvious abnormality, atraumatic Eyes: negative Throat: lips, mucosa, and tongue normal; teeth and gums normal  Respiratory: Clear to auscultation bilaterally  CV: Regular rate and rhythm  Breasts:  Normal appearance, no masses or tenderness, no nipple retraction or dimpling  GI: Soft, non-tender; bowel sounds  normal; no masses,  no organomegaly  GU: External Genitalia:  Tanner V, no lesion Urethra:  No prolapse   Vagina: Pink, normal rugae, no blood or discharge  Cervix: No CMT, no lesion  Uterus:  Normal size and contour, non tender  Adnexa: Normal, no masses, non tender  Musculoskeletal: No edema, redness or tenderness in the calves or thighs  Skin: No lesions or rash  Lymphatic: Axillary adenopathy: none     Psychiatric: Normal mood and behavior      Assessment:    Healthy female exam.    Plan:    1.  Pap with cotesting.  Explained the rationale to screen every 3 years. 2.  Discussed the signs and symptoms of ovarian cancer.  Patient is not having any new symptoms but understands what to watch for. 3.  Patient has not been tested for celiac disease which we ordered TTG IgA and total IgA today. 4.  DEXA ordered.  Patient also sent a link for a calcium calculator to make sure that she is a obtaining enough calcium in her diet.  1200 mg of calcium is the recommended dose.  Patient also takes supplemental vitamin D. 5.  Colonoscopy is up-to-date. 6.  Mammogram due April 2023.

## 2021-12-03 ENCOUNTER — Encounter: Payer: Self-pay | Admitting: Obstetrics & Gynecology

## 2021-12-03 LAB — CYTOLOGY - PAP
Comment: NEGATIVE
Diagnosis: NEGATIVE
High risk HPV: NEGATIVE

## 2021-12-03 LAB — TISSUE TRANSGLUTAMINASE, IGA: (tTG) Ab, IgA: <1 U/mL

## 2021-12-03 LAB — IGA: Immunoglobulin A: 135 mg/dL (ref 70–320)

## 2021-12-09 ENCOUNTER — Encounter: Payer: Self-pay | Admitting: Internal Medicine

## 2021-12-09 ENCOUNTER — Telehealth: Payer: Self-pay | Admitting: Internal Medicine

## 2021-12-09 NOTE — Telephone Encounter (Signed)
Pt has stated "Ive been feeling bad since last Thurs/Fri. Have been coughing a lot and started coughing up green stuff. Went to urgent care on [address] yesterday morning and the doctor tested me for strep, COVID and flu and I was negative for all. She gave me some cough medicine and said I had a viral infection and would likely start feeling better in next couple of days. Im actually worse today than yesterday and am coughing up the green stuff along with some blood. I wondered if there was anything else I should do or if you would need to see me. Please let me know when you have a chance. I can scan and send the summary from my urgent care visit if that would be helpful."

## 2021-12-09 NOTE — Telephone Encounter (Signed)
Pt checking response of 2-13 mychart message  Offered pt an appt, pt declined stating she will wail on provider's reccomendations

## 2021-12-10 ENCOUNTER — Other Ambulatory Visit: Payer: Self-pay | Admitting: Internal Medicine

## 2021-12-10 MED ORDER — AZITHROMYCIN 250 MG PO TABS: ORAL_TABLET | ORAL | 0 refills | Status: DC

## 2021-12-10 NOTE — Telephone Encounter (Signed)
Addressed Thx 

## 2021-12-13 ENCOUNTER — Ambulatory Visit: Payer: Managed Care, Other (non HMO) | Admitting: Internal Medicine

## 2021-12-27 ENCOUNTER — Encounter: Payer: Self-pay | Admitting: Obstetrics & Gynecology

## 2021-12-30 ENCOUNTER — Telehealth: Payer: Self-pay | Admitting: *Deleted

## 2021-12-30 NOTE — Telephone Encounter (Signed)
Patient given contact information to [facility] to schedule appointments for Mammo and Dexa ?

## 2022-02-10 ENCOUNTER — Other Ambulatory Visit (INDEPENDENT_AMBULATORY_CARE_PROVIDER_SITE_OTHER): Payer: Managed Care, Other (non HMO)

## 2022-02-10 DIAGNOSIS — Z0000 Encounter for general adult medical examination without abnormal findings: Secondary | ICD-10-CM

## 2022-02-10 LAB — URINALYSIS, ROUTINE W REFLEX MICROSCOPIC
Bilirubin Urine: NEGATIVE
Ketones, ur: NEGATIVE
Nitrite: NEGATIVE
Specific Gravity, Urine: 1.025 (ref 1.000–1.030)
Total Protein, Urine: NEGATIVE
Urine Glucose: NEGATIVE
Urobilinogen, UA: 0.2 (ref 0.0–1.0)
pH: 6 (ref 5.0–8.0)

## 2022-02-10 LAB — LIPID PANEL
Cholesterol: 191 mg/dL (ref 0–200)
HDL: 95.8 mg/dL (ref >39.00)
LDL Cholesterol: 88 mg/dL (ref 0–99)
NonHDL: 95.48
Total CHOL/HDL Ratio: 2
Triglycerides: 39 mg/dL (ref 0.0–149.0)
VLDL: 7.8 mg/dL (ref 0.0–40.0)

## 2022-02-10 LAB — CBC WITH DIFFERENTIAL/PLATELET
Basophils Absolute: 0 10*3/uL (ref 0.0–0.1)
Basophils Relative: 0.6 % (ref 0.0–3.0)
Eosinophils Absolute: 0 10*3/uL (ref 0.0–0.7)
Eosinophils Relative: 0.8 % (ref 0.0–5.0)
HCT: 36.7 % (ref 36.0–46.0)
Hemoglobin: 12.8 g/dL (ref 12.0–15.0)
Lymphocytes Relative: 31.3 % (ref 12.0–46.0)
Lymphs Abs: 1.4 10*3/uL (ref 0.7–4.0)
MCHC: 35 g/dL (ref 30.0–36.0)
MCV: 87 fl (ref 78.0–100.0)
Monocytes Absolute: 0.4 10*3/uL (ref 0.1–1.0)
Monocytes Relative: 9.6 % (ref 3.0–12.0)
Neutro Abs: 2.6 10*3/uL (ref 1.4–7.7)
Neutrophils Relative %: 57.7 % (ref 43.0–77.0)
Platelets: 190 10*3/uL (ref 150.0–400.0)
RBC: 4.22 Mil/uL (ref 3.87–5.11)
RDW: 13.1 % (ref 11.5–15.5)
WBC: 4.6 10*3/uL (ref 4.0–10.5)

## 2022-02-10 LAB — COMPREHENSIVE METABOLIC PANEL
ALT: 21 U/L (ref 0–35)
AST: 25 U/L (ref 0–37)
Albumin: 4.2 g/dL (ref 3.5–5.2)
Alkaline Phosphatase: 65 U/L (ref 39–117)
BUN: 10 mg/dL (ref 6–23)
CO2: 32 mEq/L (ref 19–32)
Calcium: 8.9 mg/dL (ref 8.4–10.5)
Chloride: 102 mEq/L (ref 96–112)
Creatinine, Ser: 0.7 mg/dL (ref 0.40–1.20)
GFR: 90.97 mL/min (ref >60.00)
Glucose, Bld: 77 mg/dL (ref 70–99)
Potassium: 4.4 mEq/L (ref 3.5–5.1)
Sodium: 139 mEq/L (ref 135–145)
Total Bilirubin: 0.9 mg/dL (ref 0.2–1.2)
Total Protein: 6.8 g/dL (ref 6.0–8.3)

## 2022-02-10 LAB — TSH: TSH: 3.93 u[IU]/mL (ref 0.35–5.50)

## 2022-02-12 ENCOUNTER — Encounter: Payer: Self-pay | Admitting: Internal Medicine

## 2022-02-12 ENCOUNTER — Ambulatory Visit (INDEPENDENT_AMBULATORY_CARE_PROVIDER_SITE_OTHER): Payer: Managed Care, Other (non HMO) | Admitting: Internal Medicine

## 2022-02-12 VITALS — BP 126/72 | HR 67 | Temp 98.1°F | Ht 67.0 in | Wt 113.0 lb

## 2022-02-12 DIAGNOSIS — I2583 Coronary atherosclerosis due to lipid rich plaque: Secondary | ICD-10-CM

## 2022-02-12 DIAGNOSIS — G4700 Insomnia, unspecified: Secondary | ICD-10-CM

## 2022-02-12 DIAGNOSIS — I2510 Atherosclerotic heart disease of native coronary artery without angina pectoris: Secondary | ICD-10-CM

## 2022-02-12 DIAGNOSIS — Z0000 Encounter for general adult medical examination without abnormal findings: Secondary | ICD-10-CM | POA: Diagnosis not present

## 2022-02-12 DIAGNOSIS — E739 Lactose intolerance, unspecified: Secondary | ICD-10-CM

## 2022-02-12 DIAGNOSIS — E039 Hypothyroidism, unspecified: Secondary | ICD-10-CM | POA: Diagnosis not present

## 2022-02-12 DIAGNOSIS — Z23 Encounter for immunization: Secondary | ICD-10-CM

## 2022-02-12 MED ORDER — CEPHALEXIN 500 MG PO CAPS: 500.0000 mg | ORAL_CAPSULE | Freq: Four times a day (QID) | ORAL | 0 refills | Status: DC

## 2022-02-12 MED ORDER — LEVOTHYROXINE SODIUM 88 MCG PO TABS: 88.0000 ug | ORAL_TABLET | Freq: Every day | ORAL | 3 refills | Status: DC

## 2022-02-12 MED ORDER — ZOLPIDEM TARTRATE 5 MG PO TABS: ORAL_TABLET | ORAL | 2 refills | Status: DC

## 2022-02-12 MED ORDER — ICOSAPENT ETHYL 1 G PO CAPS: 2.0000 g | ORAL_CAPSULE | Freq: Two times a day (BID) | ORAL | 3 refills | Status: DC

## 2022-02-12 NOTE — Assessment & Plan Note (Signed)
On Vascepa, ASA ?

## 2022-02-12 NOTE — Assessment & Plan Note (Signed)
On Levothyroxine 

## 2022-02-12 NOTE — Progress Notes (Signed)
? ?Subjective:  ?Patient ID: Carol Mata, female    DOB: 07-12-1956  Age: 66 y.o. MRN: [id number] ? ?CC: Physical ? ? ?HPI ?Carol Mata presents for well exam ? ?Outpatient Medications Prior to Visit  ?Medication Sig Dispense Refill  ? b complex vitamins tablet Take 1 tablet by mouth daily. 100 tablet 3  ? Cholecalciferol (VITAMIN D3) 50 MCG (2000 UT) capsule Take 1 capsule (2,000 Units total) by mouth daily. 100 capsule 3  ? fluticasone (FLONASE) 50 MCG/ACT nasal spray Place 2 sprays into both nostrils as needed. 16 g 4  ? azithromycin (ZITHROMAX Z-PAK) 250 MG tablet As directed 6 tablet 0  ? icosapent Ethyl (VASCEPA) 1 g capsule Take 2 capsules (2 g total) by mouth 2 (two) times daily. 360 capsule 3  ? levothyroxine (SYNTHROID) 88 MCG tablet Take 1 tablet (88 mcg total) by mouth daily before breakfast. 90 tablet 3  ? Omega-3 Fatty Acids (FISH OIL) 1000 MG CAPS Take by mouth.    ? zolpidem (AMBIEN) 5 MG tablet TAKE ONE TABLET AT BEDTIME AS NEEDED FOR SLEEP 30 tablet 2  ? rizatriptan (MAXALT) 10 MG tablet Take 1 tablet (10 mg total) by mouth once as needed for up to 1 dose for migraine. May repeat in 2 hours if needed (Patient not taking: Reported on 12/02/2021) 12 tablet 12  ? ?No facility-administered medications prior to visit.  ? ? ?ROS: ?Review of Systems  ?Constitutional:  Negative for activity change, appetite change, chills, fatigue and unexpected weight change.  ?HENT:  Negative for congestion, mouth sores and sinus pressure.   ?Eyes:  Negative for visual disturbance.  ?Respiratory:  Negative for cough and chest tightness.   ?Gastrointestinal:  Negative for abdominal pain and nausea.  ?Genitourinary:  Negative for difficulty urinating, frequency and vaginal pain.  ?Musculoskeletal:  Negative for back pain and gait problem.  ?Skin:  Negative for pallor and rash.  ?Neurological:  Negative for dizziness, tremors, weakness, numbness and headaches.  ?Psychiatric/Behavioral:  Positive for sleep  disturbance. Negative for confusion and suicidal ideas.   ? ?Objective:  ?BP 126/72 (BP Location: Right Arm, Patient Position: Sitting, Cuff Size: Normal)   Pulse 67   Temp 98.1 ?F (36.7 ?C) (Oral)   Ht 5\' 7"  (1.702 m)   Wt 113 lb (51.3 kg)   SpO2 96%   BMI 17.70 kg/m?  ? ?BP Readings from Last 3 Encounters:  ?02/12/22 126/72  ?12/02/21 126/74  ?08/08/21 112/71  ? ? ?Wt Readings from Last 3 Encounters:  ?02/12/22 113 lb (51.3 kg)  ?12/02/21 114 lb (51.7 kg)  ?08/08/21 115 lb 9.6 oz (52.4 kg)  ? ? ?Physical Exam ?Constitutional:   ?   General: She is not in acute distress. ?   Appearance: She is well-developed.  ?HENT:  ?   Head: Normocephalic.  ?   Right Ear: External ear normal.  ?   Left Ear: External ear normal.  ?   Nose: Nose normal.  ?Eyes:  ?   General:     ?   Right eye: No discharge.     ?   Left eye: No discharge.  ?   Conjunctiva/sclera: Conjunctivae normal.  ?   Pupils: Pupils are equal, round, and reactive to light.  ?Neck:  ?   Thyroid: No thyromegaly.  ?   Vascular: No JVD.  ?   Trachea: No tracheal deviation.  ?Cardiovascular:  ?   Rate and Rhythm: Normal rate and regular rhythm.  ?  Heart sounds: Normal heart sounds.  ?Pulmonary:  ?   Effort: No respiratory distress.  ?   Breath sounds: No stridor. No wheezing.  ?Abdominal:  ?   General: Bowel sounds are normal. There is no distension.  ?   Palpations: Abdomen is soft. There is no mass.  ?   Tenderness: There is no abdominal tenderness. There is no guarding or rebound.  ?Musculoskeletal:     ?   General: No tenderness.  ?   Cervical back: Normal range of motion and neck supple. No rigidity.  ?Lymphadenopathy:  ?   Cervical: No cervical adenopathy.  ?Skin: ?   Findings: No erythema or rash.  ?Neurological:  ?   Cranial Nerves: No cranial nerve deficit.  ?   Motor: No abnormal muscle tone.  ?   Coordination: Coordination normal.  ?   Deep Tendon Reflexes: Reflexes normal.  ?Psychiatric:     ?   Behavior: Behavior normal.     ?   Thought  Content: Thought content normal.     ?   Judgment: Judgment normal.  ?Thin ?No tremor ? ?Lab Results  ?Component Value Date  ? WBC 4.6 02/10/2022  ? HGB 12.8 02/10/2022  ? HCT 36.7 02/10/2022  ? PLT 190.0 02/10/2022  ? GLUCOSE 77 02/10/2022  ? CHOL 191 02/10/2022  ? TRIG 39.0 02/10/2022  ? HDL 95.80 02/10/2022  ? LDLCALC 88 02/10/2022  ? ALT 21 02/10/2022  ? AST 25 02/10/2022  ? NA 139 02/10/2022  ? K 4.4 02/10/2022  ? CL 102 02/10/2022  ? CREATININE 0.70 02/10/2022  ? BUN 10 02/10/2022  ? CO2 32 02/10/2022  ? TSH 3.93 02/10/2022  ? ? ?No results found. ? ?Assessment & Plan:  ? ?Problem List Items Addressed This Visit   ? ? Hypothyroidism  ?  On Levothyroxine ?  ?  ? Relevant Medications  ? levothyroxine (SYNTHROID) 88 MCG tablet  ? Lactose intolerance  ?  Off milk ? ?  ?  ? Well adult exam  ?  We discussed age appropriate health related issues, including available/recomended screening tests and vaccinations. We discussed a need for adhering to healthy diet and exercise. Labs were ordered to be later reviewed . All questions were answered. ?A cardiac CT scan for coronary calcium score 115 - 2021 ?Colon 2015, 2020 nl Dr Kinnie Scales ?  ?  ? Coronary atherosclerosis  ?  On Vascepa, ASA ?  ?  ? Relevant Medications  ? icosapent Ethyl (VASCEPA) 1 g capsule  ? Insomnia  ?  Zolpidem prn ? Potential benefits of a long term opioids use as well as potential risks (i.e. addiction risk, apnea etc) and complications (i.e. Somnolence, constipation and others) were explained to the patient and were aknowledged. ? ?  ?  ?  ? ? ?Meds ordered this encounter  ?Medications  ? cephALEXin (KEFLEX) 500 MG capsule  ?  Sig: Take 1 capsule (500 mg total) by mouth 4 (four) times daily.  ?  Dispense:  20 capsule  ?  Refill:  0  ? levothyroxine (SYNTHROID) 88 MCG tablet  ?  Sig: Take 1 tablet (88 mcg total) by mouth daily before breakfast.  ?  Dispense:  90 tablet  ?  Refill:  3  ? zolpidem (AMBIEN) 5 MG tablet  ?  Sig: TAKE ONE TABLET AT BEDTIME AS  NEEDED FOR SLEEP  ?  Dispense:  30 tablet  ?  Refill:  2  ? icosapent Ethyl (VASCEPA) 1  g capsule  ?  Sig: Take 2 capsules (2 g total) by mouth 2 (two) times daily.  ?  Dispense:  360 capsule  ?  Refill:  3  ?  ? ? ?Follow-up: Return in about 6 months (around 08/14/2022) for a follow-up visit. ? ?Sonda PrimesAlex Falynn Ailey, MD ?

## 2022-02-12 NOTE — Assessment & Plan Note (Signed)
Off milk 

## 2022-02-12 NOTE — Assessment & Plan Note (Signed)
We discussed age appropriate health related issues, including available/recomended screening tests and vaccinations. We discussed a need for adhering to healthy diet and exercise. Labs were ordered to be later reviewed . All questions were answered. ?A cardiac CT scan for coronary calcium score 115 - 2021 ?Colon 2015, 2020 nl Dr Kinnie Scales ?

## 2022-02-12 NOTE — Assessment & Plan Note (Signed)
Zolpidem prn  Potential benefits of a long term opioids use as well as potential risks (i.e. addiction risk, apnea etc) and complications (i.e. Somnolence, constipation and others) were explained to the patient and were aknowledged.  

## 2022-02-12 NOTE — Patient Instructions (Signed)
Blue-Emu cream -- use 2-3 times a day ? ?

## 2022-04-14 ENCOUNTER — Ambulatory Visit: Payer: Managed Care, Other (non HMO)

## 2022-04-24 ENCOUNTER — Ambulatory Visit: Payer: Managed Care, Other (non HMO)

## 2022-05-08 ENCOUNTER — Ambulatory Visit
Admission: RE | Admit: 2022-05-08 | Discharge: 2022-05-08 | Disposition: A | Discharge status: Home / Self Care | Payer: Managed Care, Other (non HMO) | Source: Ambulatory Visit | Attending: Obstetrics & Gynecology | Admitting: Obstetrics & Gynecology

## 2022-05-08 DIAGNOSIS — Z01419 Encounter for gynecological examination (general) (routine) without abnormal findings: Secondary | ICD-10-CM

## 2022-05-29 ENCOUNTER — Other Ambulatory Visit: Payer: Managed Care, Other (non HMO)

## 2022-05-29 ENCOUNTER — Ambulatory Visit: Payer: Managed Care, Other (non HMO)

## 2022-09-03 ENCOUNTER — Encounter: Payer: Self-pay | Admitting: Internal Medicine

## 2022-09-04 ENCOUNTER — Encounter: Payer: Self-pay | Admitting: Internal Medicine

## 2022-09-05 ENCOUNTER — Telehealth: Payer: Managed Care, Other (non HMO) | Admitting: Physician Assistant

## 2022-09-05 ENCOUNTER — Other Ambulatory Visit: Payer: Self-pay | Admitting: Internal Medicine

## 2022-09-05 DIAGNOSIS — B9689 Other specified bacterial agents as the cause of diseases classified elsewhere: Secondary | ICD-10-CM

## 2022-09-05 DIAGNOSIS — J3489 Other specified disorders of nose and nasal sinuses: Secondary | ICD-10-CM

## 2022-09-05 DIAGNOSIS — J019 Acute sinusitis, unspecified: Secondary | ICD-10-CM | POA: Diagnosis not present

## 2022-09-05 MED ORDER — MUPIROCIN 2 % EX OINT: 1.0000 | TOPICAL_OINTMENT | Freq: Two times a day (BID) | CUTANEOUS | 0 refills | Status: DC

## 2022-09-05 MED ORDER — AZITHROMYCIN 250 MG PO TABS: ORAL_TABLET | ORAL | 0 refills | Status: DC

## 2022-09-05 MED ORDER — AZITHROMYCIN 250 MG PO TABS: ORAL_TABLET | ORAL | 0 refills | Status: AC

## 2022-09-05 NOTE — Patient Instructions (Signed)
Carol Mata, thank you for joining Margaretann Loveless, PA-C for today's virtual visit.  While this provider is not your primary care provider (PCP), if your PCP is located in our provider database this encounter information will be shared with them immediately following your visit.   A [facility] MyChart account gives you access to today's visit and all your visits, tests, and labs performed at [facility] " click here if you don't have a [facility] MyChart account or go to [email or web address]  Consent: (Patient) Carol Mata provided verbal consent for this virtual visit at the beginning of the encounter.  Current Medications:  Current Outpatient Medications:    azithromycin (ZITHROMAX) 250 MG tablet, Take 2 tablets on day 1, then 1 tablet daily on days 2 through 5, Disp: 6 tablet, Rfl: 0   mupirocin ointment (BACTROBAN) 2 %, Apply 1 Application topically 2 (two) times daily., Disp: 22 g, Rfl: 0   b complex vitamins tablet, Take 1 tablet by mouth daily., Disp: 100 tablet, Rfl: 3   cephALEXin (KEFLEX) 500 MG capsule, Take 1 capsule (500 mg total) by mouth 4 (four) times daily., Disp: 20 capsule, Rfl: 0   Cholecalciferol (VITAMIN D3) 50 MCG (2000 UT) capsule, Take 1 capsule (2,000 Units total) by mouth daily., Disp: 100 capsule, Rfl: 3   fluticasone (FLONASE) 50 MCG/ACT nasal spray, Place 2 sprays into both nostrils as needed., Disp: 16 g, Rfl: 4   icosapent Ethyl (VASCEPA) 1 g capsule, Take 2 capsules (2 g total) by mouth 2 (two) times daily., Disp: 360 capsule, Rfl: 3   levothyroxine (SYNTHROID) 88 MCG tablet, Take 1 tablet (88 mcg total) by mouth daily before breakfast., Disp: 90 tablet, Rfl: 3   zolpidem (AMBIEN) 5 MG tablet, TAKE ONE TABLET AT BEDTIME AS NEEDED FOR SLEEP, Disp: 30 tablet, Rfl: 2   Medications ordered in this encounter:  Meds ordered this encounter  Medications   azithromycin (ZITHROMAX) 250 MG tablet    Sig: Take 2 tablets on day 1, then 1  tablet daily on days 2 through 5    Dispense:  6 tablet    Refill:  0    Order Specific Question:   Supervising Provider    Answer:   Merrilee Jansky [[id number]]   mupirocin ointment (BACTROBAN) 2 %    Sig: Apply 1 Application topically 2 (two) times daily.    Dispense:  22 g    Refill:  0    Order Specific Question:   Supervising Provider    Answer:   Merrilee Jansky [id number]     *If you need refills on other medications prior to your next appointment, please contact your pharmacy*  Follow-Up: Call back or seek an in-person evaluation if the symptoms worsen or if the condition fails to improve as anticipated.  [facility] [phone]  Other Instructions Sinus Infection, Adult A sinus infection, also called sinusitis, is inflammation of your sinuses. Sinuses are hollow spaces in the bones around your face. Your sinuses are located: Around your eyes. In the middle of your forehead. Behind your nose. In your cheekbones. Mucus normally drains out of your sinuses. When your nasal tissues become inflamed or swollen, mucus can become trapped or blocked. This allows bacteria, viruses, and fungi to grow, which leads to infection. Most infections of the sinuses are caused by a virus. A sinus infection can develop quickly. It can last for up to 4 weeks (acute) or for more than 12  weeks (chronic). A sinus infection often develops after a cold. What are the causes? This condition is caused by anything that creates swelling in the sinuses or stops mucus from draining. This includes: Allergies. Asthma. Infection from bacteria or viruses. Deformities or blockages in your nose or sinuses. Abnormal growths in the nose (nasal polyps). Pollutants, such as chemicals or irritants in the air. Infection from fungi. This is rare. What increases the risk? You are more likely to develop this condition if you: Have a weak body defense system (immune system). Do a lot of swimming or  diving. Overuse nasal sprays. Smoke. What are the signs or symptoms? The main symptoms of this condition are pain and a feeling of pressure around the affected sinuses. Other symptoms include: Stuffy nose or congestion that makes it difficult to breathe through your nose. Thick yellow or greenish drainage from your nose. Tenderness, swelling, and warmth over the affected sinuses. A cough that may get worse at night. Decreased sense of smell and taste. Extra mucus that collects in the throat or the back of the nose (postnasal drip) causing a sore throat or bad breath. Tiredness (fatigue). Fever. How is this diagnosed? This condition is diagnosed based on: Your symptoms. Your medical history. A physical exam. Tests to find out if your condition is acute or chronic. This may include: Checking your nose for nasal polyps. Viewing your sinuses using a device that has a light (endoscope). Testing for allergies or bacteria. Imaging tests, such as an MRI or CT scan. In rare cases, a bone biopsy may be done to rule out more serious types of fungal sinus disease. How is this treated? Treatment for a sinus infection depends on the cause and whether your condition is chronic or acute. If caused by a virus, your symptoms should go away on their own within 10 days. You may be given medicines to relieve symptoms. They include: Medicines that shrink swollen nasal passages (decongestants). A spray that eases inflammation of the nostrils (topical intranasal corticosteroids). Rinses that help get rid of thick mucus in your nose (nasal saline washes). Medicines that treat allergies (antihistamines). Over-the-counter pain relievers. If caused by bacteria, your health care provider may recommend waiting to see if your symptoms improve. Most bacterial infections will get better without antibiotic medicine. You may be given antibiotics if you have: A severe infection. A weak immune system. If caused by  narrow nasal passages or nasal polyps, surgery may be needed. Follow these instructions at home: Medicines Take, use, or apply over-the-counter and prescription medicines only as told by your health care provider. These may include nasal sprays. If you were prescribed an antibiotic medicine, take it as told by your health care provider. Do not stop taking the antibiotic even if you start to feel better. Hydrate and humidify  Drink enough fluid to keep your urine pale yellow. Staying hydrated will help to thin your mucus. Use a cool mist humidifier to keep the humidity level in your home above 50%. Inhale steam for 10-15 minutes, 3-4 times a day, or as told by your health care provider. You can do this in the bathroom while a hot shower is running. Limit your exposure to cool or dry air. Rest Rest as much as possible. Sleep with your head raised (elevated). Make sure you get enough sleep each night. General instructions  Apply a warm, moist washcloth to your face 3-4 times a day or as told by your health care provider. This will help with discomfort.  Use nasal saline washes as often as told by your health care provider. Wash your hands often with soap and water to reduce your exposure to germs. If soap and water are not available, use hand sanitizer. Do not smoke. Avoid being around people who are smoking (secondhand smoke). Keep all follow-up visits. This is important. Contact a health care provider if: You have a fever. Your symptoms get worse. Your symptoms do not improve within 10 days. Get help right away if: You have a severe headache. You have persistent vomiting. You have severe pain or swelling around your face or eyes. You have vision problems. You develop confusion. Your neck is stiff. You have trouble breathing. These symptoms may be an emergency. Get help right away. Call 911. Do not wait to see if the symptoms will go away. Do not drive yourself to the  hospital. Summary A sinus infection is soreness and inflammation of your sinuses. Sinuses are hollow spaces in the bones around your face. This condition is caused by nasal tissues that become inflamed or swollen. The swelling traps or blocks the flow of mucus. This allows bacteria, viruses, and fungi to grow, which leads to infection. If you were prescribed an antibiotic medicine, take it as told by your health care provider. Do not stop taking the antibiotic even if you start to feel better. Keep all follow-up visits. This is important. This information is not intended to replace advice given to you by your health care provider. Make sure you discuss any questions you have with your health care provider. Document Revised: 09/17/2021 Document Reviewed: 09/17/2021 Elsevier Patient Education  2023 [employer]    If you have been instructed to have an in-person evaluation today at a local Urgent Care facility, please use the link below. It will take you to a list of all of our available [facility], including address, phone number and hours of operation. Please do not delay care.  [facility]  If you or a family member do not have a primary care provider, use the link below to schedule a visit and establish care. When you choose a [facility] primary care physician or advanced practice provider, you gain a long-term partner in health. Find a Primary Care Provider  Learn more about [facility] in-office and virtual care options:  - Get Care Now

## 2022-09-05 NOTE — Progress Notes (Signed)
Virtual Visit Consent   Carol Mata, you are scheduled for a virtual visit with a [facility] provider today. Just as with appointments in the office, your consent must be obtained to participate. Your consent will be active for this visit and any virtual visit you may have with one of our providers in the next 365 days. If you have a MyChart account, a copy of this consent can be sent to you electronically.  As this is a virtual visit, video technology does not allow for your provider to perform a traditional examination. This may limit your provider's ability to fully assess your condition. If your provider identifies any concerns that need to be evaluated in person or the need to arrange testing (such as labs, EKG, etc.), we will make arrangements to do so. Although advances in technology are sophisticated, we cannot ensure that it will always work on either your end or our end. If the connection with a video visit is poor, the visit may have to be switched to a telephone visit. With either a video or telephone visit, we are not always able to ensure that we have a secure connection.  By engaging in this virtual visit, you consent to the provision of healthcare and authorize for your insurance to be billed (if applicable) for the services provided during this visit. Depending on your insurance coverage, you may receive a charge related to this service.  I need to obtain your verbal consent now. Are you willing to proceed with your visit today? Carol Mata has provided verbal consent on 09/05/2022 for a virtual visit (video or telephone). Margaretann Loveless, PA-C  Date: 09/05/2022 12:23 PM  Virtual Visit via Video Note   I, Margaretann Loveless, connected with  Carol Mata  ([id number], Apr 07, 1956) on 09/05/22 at 12:15 PM EST by a video-enabled telemedicine application and verified that I am speaking with the correct person using two identifiers.  Location: Patient: Virtual Visit  Location Patient: Other: work;isolated Provider: Virtual Visit Location Provider: Home Office   I discussed the limitations of evaluation and management by telemedicine and the availability of in person appointments. The patient expressed understanding and agreed to proceed.    History of Present Illness: Carol Mata is a 66 y.o. who identifies as a female who was assigned female at birth, and is being seen today for possible sinus infection.  HPI: Sinusitis This is a new problem. The current episode started in the past 7 days. The problem has been gradually worsening since onset. There has been no fever. Associated symptoms include chills, congestion, coughing, headaches, a hoarse voice and sinus pressure. Pertinent negatives include no sore throat. (Rhinorrhea and post nasal drainage) Past treatments include acetaminophen and saline nose sprays (1/2 dose dayquil, nyquil). The treatment provided no relief.     Problems:  Patient Active Problem List   Diagnosis Date Noted   Tremor 08/08/2021   COVID-19 03/22/2021   Insomnia 07/23/2020   Coronary atherosclerosis 01/19/2020   Vitamin D deficiency 01/19/2020   Well adult exam 09/28/2017   IBS (irritable bowel syndrome) 09/26/2016   Lactose intolerance 09/26/2016   Osteoporosis 09/26/2016   Vertigo    Atrophic vaginitis    Hypothyroidism     Allergies:  Allergies  Allergen Reactions   Lactose Intolerance (Gi)    Other Diarrhea   Medications:  Current Outpatient Medications:    azithromycin (ZITHROMAX) 250 MG tablet, Take 2 tablets on day 1, then 1 tablet daily on  days 2 through 5, Disp: 6 tablet, Rfl: 0   mupirocin ointment (BACTROBAN) 2 %, Apply 1 Application topically 2 (two) times daily., Disp: 22 g, Rfl: 0   b complex vitamins tablet, Take 1 tablet by mouth daily., Disp: 100 tablet, Rfl: 3   cephALEXin (KEFLEX) 500 MG capsule, Take 1 capsule (500 mg total) by mouth 4 (four) times daily., Disp: 20 capsule, Rfl: 0    Cholecalciferol (VITAMIN D3) 50 MCG (2000 UT) capsule, Take 1 capsule (2,000 Units total) by mouth daily., Disp: 100 capsule, Rfl: 3   fluticasone (FLONASE) 50 MCG/ACT nasal spray, Place 2 sprays into both nostrils as needed., Disp: 16 g, Rfl: 4   icosapent Ethyl (VASCEPA) 1 g capsule, Take 2 capsules (2 g total) by mouth 2 (two) times daily., Disp: 360 capsule, Rfl: 3   levothyroxine (SYNTHROID) 88 MCG tablet, Take 1 tablet (88 mcg total) by mouth daily before breakfast., Disp: 90 tablet, Rfl: 3   zolpidem (AMBIEN) 5 MG tablet, TAKE ONE TABLET AT BEDTIME AS NEEDED FOR SLEEP, Disp: 30 tablet, Rfl: 2  Observations/Objective: Patient is well-developed, well-nourished in no acute distress.  Resting comfortably at home.  Head is normocephalic, atraumatic.  No labored breathing.  Speech is clear and coherent with logical content.  Patient is alert and oriented at baseline.    Assessment and Plan: 1. Acute bacterial sinusitis - azithromycin (ZITHROMAX) 250 MG tablet; Take 2 tablets on day 1, then 1 tablet daily on days 2 through 5  Dispense: 6 tablet; Refill: 0 - mupirocin ointment (BACTROBAN) 2 %; Apply 1 Application topically 2 (two) times daily.  Dispense: 22 g; Refill: 0  - Worsening symptoms that have not responded to OTC medications.  - Will give Zpack - Mupirocin for nasal sore - Continue allergy medications.  - Steam and humidifier can help - Stay well hydrated and get plenty of rest.  - Seek in person evaluation if no symptom improvement or if symptoms worsen   Follow Up Instructions: I discussed the assessment and treatment plan with the patient. The patient was provided an opportunity to ask questions and all were answered. The patient agreed with the plan and demonstrated an understanding of the instructions.  A copy of instructions were sent to the patient via MyChart unless otherwise noted below.    The patient was advised to call back or seek an in-person evaluation if the  symptoms worsen or if the condition fails to improve as anticipated.  Time:  I spent 11 minutes with the patient via telehealth technology discussing the above problems/concerns.    Margaretann Loveless, PA-C

## 2022-09-22 ENCOUNTER — Ambulatory Visit
Admission: RE | Admit: 2022-09-22 | Discharge: 2022-09-22 | Disposition: A | Discharge status: Home / Self Care | Payer: Managed Care, Other (non HMO) | Source: Ambulatory Visit | Attending: Obstetrics & Gynecology | Admitting: Obstetrics & Gynecology

## 2022-09-25 ENCOUNTER — Encounter: Payer: Self-pay | Admitting: Obstetrics and Gynecology

## 2022-09-25 DIAGNOSIS — Z411 Encounter for cosmetic surgery: Secondary | ICD-10-CM | POA: Insufficient documentation

## 2023-03-04 ENCOUNTER — Other Ambulatory Visit: Payer: Self-pay | Admitting: Internal Medicine

## 2023-03-04 ENCOUNTER — Encounter: Payer: Self-pay | Admitting: Internal Medicine

## 2023-03-05 MED ORDER — ICOSAPENT ETHYL 1 G PO CAPS: 2.0000 g | ORAL_CAPSULE | Freq: Two times a day (BID) | ORAL | 0 refills | Status: DC

## 2023-03-05 MED ORDER — LEVOTHYROXINE SODIUM 88 MCG PO TABS: 88.0000 ug | ORAL_TABLET | Freq: Every day | ORAL | 0 refills | Status: DC

## 2023-03-05 NOTE — Addendum Note (Signed)
Addended by: Deatra James on: 03/05/2023 08:37 AM   Modules accepted: Orders

## 2023-03-12 ENCOUNTER — Ambulatory Visit: Payer: Managed Care, Other (non HMO) | Admitting: Internal Medicine

## 2023-03-12 ENCOUNTER — Encounter: Payer: Self-pay | Admitting: Internal Medicine

## 2023-03-12 VITALS — BP 118/62 | HR 74 | Temp 98.3°F | Ht 67.0 in | Wt 113.0 lb

## 2023-03-12 DIAGNOSIS — E559 Vitamin D deficiency, unspecified: Secondary | ICD-10-CM

## 2023-03-12 DIAGNOSIS — I2583 Coronary atherosclerosis due to lipid rich plaque: Secondary | ICD-10-CM | POA: Diagnosis not present

## 2023-03-12 DIAGNOSIS — Z0000 Encounter for general adult medical examination without abnormal findings: Secondary | ICD-10-CM

## 2023-03-12 DIAGNOSIS — M810 Age-related osteoporosis without current pathological fracture: Secondary | ICD-10-CM

## 2023-03-12 DIAGNOSIS — I2510 Atherosclerotic heart disease of native coronary artery without angina pectoris: Secondary | ICD-10-CM

## 2023-03-12 DIAGNOSIS — R0989 Other specified symptoms and signs involving the circulatory and respiratory systems: Secondary | ICD-10-CM

## 2023-03-12 LAB — URINALYSIS, ROUTINE W REFLEX MICROSCOPIC
Bilirubin Urine: NEGATIVE
Ketones, ur: NEGATIVE
Nitrite: NEGATIVE
Specific Gravity, Urine: <=1.005 — AB (ref 1.000–1.030)
Total Protein, Urine: NEGATIVE
Urine Glucose: NEGATIVE
Urobilinogen, UA: 0.2 (ref 0.0–1.0)
pH: 7 (ref 5.0–8.0)

## 2023-03-12 MED ORDER — ZOLPIDEM TARTRATE 5 MG PO TABS: ORAL_TABLET | ORAL | 3 refills | Status: DC

## 2023-03-12 MED ORDER — LEVOTHYROXINE SODIUM 88 MCG PO TABS: 88.0000 ug | ORAL_TABLET | Freq: Every day | ORAL | 3 refills | Status: DC

## 2023-03-12 MED ORDER — FLUTICASONE PROPIONATE 50 MCG/ACT NA SUSP: 2.0000 | NASAL | 4 refills | Status: DC | PRN

## 2023-03-12 MED ORDER — ASPIRIN 81 MG PO TBEC: 81.0000 mg | DELAYED_RELEASE_TABLET | Freq: Every day | ORAL | 3 refills | Status: AC

## 2023-03-12 MED ORDER — ICOSAPENT ETHYL 1 G PO CAPS: 2.0000 g | ORAL_CAPSULE | Freq: Two times a day (BID) | ORAL | 3 refills | Status: DC

## 2023-03-12 NOTE — Progress Notes (Signed)
Subjective:  Patient ID: Carol Mata, female    DOB: 05-09-1956  Age: 67 y.o. MRN: [id number]  CC: Annual Exam (PHYSICAL)   HPI Carol Mata presents for a well exam Stressed with aging parent Daughter is getting married  Outpatient Medications Prior to Visit  Medication Sig Dispense Refill   b complex vitamins tablet Take 1 tablet by mouth daily. 100 tablet 3   Cholecalciferol (VITAMIN D3) 50 MCG (2000 UT) capsule Take 1 capsule (2,000 Units total) by mouth daily. 100 capsule 3   mupirocin ointment (BACTROBAN) 2 % Apply 1 Application topically 2 (two) times daily. 22 g 0   fluticasone (FLONASE) 50 MCG/ACT nasal spray Place 2 sprays into both nostrils as needed. 16 g 4   icosapent Ethyl (VASCEPA) 1 g capsule Take 2 capsules (2 g total) by mouth 2 (two) times daily. Keep May appt for future refills 120 capsule 0   levothyroxine (SYNTHROID) 88 MCG tablet Take 1 tablet (88 mcg total) by mouth daily before breakfast. Keep May appt for future refills 30 tablet 0   zolpidem (AMBIEN) 5 MG tablet TAKE ONE TABLET AT BEDTIME AS NEEDED FOR SLEEP 30 tablet 2   cephALEXin (KEFLEX) 500 MG capsule Take 1 capsule (500 mg total) by mouth 4 (four) times daily. (Patient not taking: Reported on 03/12/2023) 20 capsule 0   No facility-administered medications prior to visit.    ROS: Review of Systems  Constitutional:  Negative for activity change, appetite change, chills, fatigue and unexpected weight change.  HENT:  Negative for congestion, mouth sores and sinus pressure.   Eyes:  Negative for visual disturbance.  Respiratory:  Negative for cough and chest tightness.   Gastrointestinal:  Negative for abdominal pain and nausea.  Genitourinary:  Negative for difficulty urinating, frequency and vaginal pain.  Musculoskeletal:  Negative for back pain and gait problem.  Skin:  Negative for pallor and rash.  Neurological:  Negative for dizziness, tremors, weakness, numbness and headaches.   Psychiatric/Behavioral:  Positive for sleep disturbance. Negative for confusion and suicidal ideas.     Objective:  BP 118/62 (BP Location: Right Arm, Patient Position: Sitting, Cuff Size: Large)   Pulse 74   Temp 98.3 F (36.8 C) (Oral)   Ht 5\' 7"  (1.702 m)   Wt 113 lb (51.3 kg)   SpO2 98%   BMI 17.70 kg/m   BP Readings from Last 3 Encounters:  03/12/23 118/62  02/12/22 126/72  12/02/21 126/74    Wt Readings from Last 3 Encounters:  03/12/23 113 lb (51.3 kg)  02/12/22 113 lb (51.3 kg)  12/02/21 114 lb (51.7 kg)    Physical Exam Constitutional:      General: She is not in acute distress.    Appearance: She is well-developed. She is obese.  HENT:     Head: Normocephalic.     Right Ear: External ear normal.     Left Ear: External ear normal.     Nose: Nose normal.  Eyes:     General:        Right eye: No discharge.        Left eye: No discharge.     Conjunctiva/sclera: Conjunctivae normal.     Pupils: Pupils are equal, round, and reactive to light.  Neck:     Thyroid: No thyromegaly.     Vascular: No JVD.     Trachea: No tracheal deviation.  Cardiovascular:     Rate and Rhythm: Normal rate and regular rhythm.  Heart sounds: Normal heart sounds.  Pulmonary:     Effort: No respiratory distress.     Breath sounds: No stridor. No wheezing.  Abdominal:     General: Bowel sounds are normal. There is no distension.     Palpations: Abdomen is soft. There is no mass.     Tenderness: There is no abdominal tenderness. There is no guarding or rebound.  Musculoskeletal:        General: No tenderness.     Cervical back: Normal range of motion and neck supple. No rigidity.  Lymphadenopathy:     Cervical: No cervical adenopathy.  Skin:    Findings: No erythema or rash.  Neurological:     Mental Status: She is oriented to person, place, and time.     Cranial Nerves: No cranial nerve deficit.     Motor: No abnormal muscle tone.     Coordination: Coordination normal.      Gait: Gait normal.     Deep Tendon Reflexes: Reflexes normal.  Psychiatric:        Behavior: Behavior normal.        Thought Content: Thought content normal.        Judgment: Judgment normal.     Lab Results  Component Value Date   WBC 5.3 03/13/2023   HGB 13.7 03/13/2023   HCT 39.7 03/13/2023   PLT 185.0 03/13/2023   GLUCOSE 75 03/13/2023   CHOL 183 03/13/2023   TRIG 62.0 03/13/2023   HDL 81.30 03/13/2023   LDLCALC 89 03/13/2023   ALT 13 03/13/2023   AST 19 03/13/2023   NA 139 03/13/2023   K 4.0 03/13/2023   CL 101 03/13/2023   CREATININE 0.72 03/13/2023   BUN 10 03/13/2023   CO2 30 03/13/2023   TSH 0.85 03/13/2023    DG Bone Density  Result Date: 09/22/2022 EXAM: DUAL X-RAY ABSORPTIOMETRY (DXA) FOR BONE MINERAL DENSITY IMPRESSION: Referring Physician:  Fredrich Romans LEGGETT Your patient completed a bone mineral density test using GE Lunar iDXA system (analysis version: 16). Technologist: sec PATIENT: Name: Carol Mata Patient ID: [id number] Birth Date: 1956/09/22 Height: 67.0 in. Sex: Female Measured: 09/22/2022 Weight: 117.0 lbs. Indications: Estrogen Deficient, Postmenopausal Fractures: NONE Treatments: Vitamin D (E933.5) ASSESSMENT: The BMD measured at AP Spine L1-L3 is 0.923 g/cm2 with a T-score of -2.1. This patient is considered osteopenic/low bone mass according to World Health Organization [facility]) criteria. The quality of the exam is good. L4 was excluded due to degenerative changes. Site Region Measured Date Measured Age YA BMD Significant CHANGE T-score AP Spine L1-L3 09/22/2022 65.6 -2.1 0.923 g/cm2 DualFemur Neck Right 09/22/2022 65.6 -1.5 0.823 g/cm2 DualFemur Total Mean 09/22/2022 65.6 -1.2 0.857 g/cm2 Right Forearm Radius 33% 09/22/2022 65.6 -1.4 0.753 g/cm2 World Health Organization [facility]) criteria for post-menopausal, Caucasian Women: Normal       T-score at or above -1 SD Osteopenia   T-score between -1 and -2.5 SD Osteoporosis T-score at or below -2.5 SD  RECOMMENDATION: 1. All patients should optimize calcium and vitamin D intake. 2. Consider FDA-approved medical therapies in postmenopausal women and men aged 40 years and older, based on the following: a. A hip or vertebral (clinical or morphometric) fracture. b. T-score = -2.5 at the femoral neck or spine after appropriate evaluation to exclude secondary causes. c. Low bone mass (T-score between -1.0 and -2.5 at the femoral neck or spine) and a 10-year probability of a hip fracture = 3% or a 10-year probability of a major osteoporosis-related fracture =  20% based on the [place]-adapted WHO algorithm. d. Clinician judgment and/or patient preferences may indicate treatment for people with 10-year fracture probabilities above or below these levels. FOLLOW-UP: Patients with diagnosis of osteoporosis or at high risk for fracture should have regular bone mineral density tests.? Patients eligible for Medicare are allowed routine testing every 2 years.? The testing frequency can be increased to one year for patients who have rapidly progressing disease, are receiving or discontinuing medical therapy to restore bone mass, or have additional risk factors. Mata have reviewed this study and agree with the findings. [facility] FRAX* 10-year Probability of Fracture Based on femoral neck BMD: DualFemur (Right) Major Osteoporotic Fracture: 7.6% Hip Fracture:                1.0% Population:                  [place] (Caucasian) Risk Factors:                None *FRAX is a Armed forces logistics/support/administrative officer of the [employer] of [employer] for Metabolic Bone Disease, a World Science writer (WHO) [employer]. ASSESSMENT: The probability of a major osteoporotic fracture is 7.6 % within the next ten years. The probability of a hip fracture is 1.0 % within the next ten years. Electronically Signed   By: Amie Portland M.D.   On: 09/22/2022 08:11    Assessment & Plan:   Problem List Items Addressed This Visit      Osteoporosis    We discussed age appropriate health related issues, including available/recomended screening tests and vaccinations. We discussed a need for adhering to healthy diet and exercise. Labs were ordered to be later reviewed . All questions were answered. A cardiac CT scan for coronary calcium score 115 - 2021 Colon 2015, 2020 nl Dr Kinnie Scales      Well adult exam - Primary    We discussed age appropriate health related issues, including available/recomended screening tests and vaccinations. We discussed a need for adhering to healthy diet and exercise. Labs were ordered to be later reviewed . All questions were answered. A cardiac CT scan for coronary calcium score 115 - 2021 Colon 2015, 2020 nl Dr Kinnie Scales      Relevant Orders   TSH (Completed)   Urinalysis   CBC with Differential/Platelet (Completed)   Lipid panel (Completed)   Comprehensive metabolic panel (Completed)   Coronary atherosclerosis    On Vascepa since       Relevant Medications   icosapent Ethyl (VASCEPA) 1 g capsule   aspirin EC 81 MG tablet   Vitamin D deficiency    Carotid bruit, mild B       Other Visit Diagnoses     Bilateral carotid bruits       Relevant Orders   US Carotid Bilateral         Meds ordered this encounter  Medications   icosapent Ethyl (VASCEPA) 1 g capsule    Sig: Take 2 capsules (2 g total) by mouth 2 (two) times daily. Keep May appt for future refills    Dispense:  360 capsule    Refill:  3   levothyroxine (SYNTHROID) 88 MCG tablet    Sig: Take 1 tablet (88 mcg total) by mouth daily before breakfast. Keep May appt for future refills    Dispense:  90 tablet    Refill:  3   fluticasone (FLONASE) 50 MCG/ACT nasal spray    Sig: Place 2 sprays  into both nostrils as needed.    Dispense:  16 g    Refill:  4   zolpidem (AMBIEN) 5 MG tablet    Sig: TAKE ONE TABLET AT BEDTIME AS NEEDED FOR SLEEP    Dispense:  30 tablet    Refill:  3   aspirin EC 81 MG tablet    Sig: Take 1  tablet (81 mg total) by mouth daily.    Dispense:  100 tablet    Refill:  3      Follow-up: Return in about 3 months (around 06/12/2023) for a follow-up visit.  Sonda Primes, MD

## 2023-03-12 NOTE — Assessment & Plan Note (Signed)
We discussed age appropriate health related issues, including available/recomended screening tests and vaccinations. We discussed a need for adhering to healthy diet and exercise. Labs were ordered to be later reviewed . All questions were answered. ?A cardiac CT scan for coronary calcium score 115 - 2021 ?Colon 2015, 2020 nl Dr Medoff ?

## 2023-03-12 NOTE — Assessment & Plan Note (Signed)
Carotid bruit, mild B

## 2023-03-12 NOTE — Assessment & Plan Note (Signed)
On Vascepa since

## 2023-03-13 ENCOUNTER — Encounter: Payer: Self-pay | Admitting: Internal Medicine

## 2023-03-13 ENCOUNTER — Other Ambulatory Visit (INDEPENDENT_AMBULATORY_CARE_PROVIDER_SITE_OTHER): Payer: Managed Care, Other (non HMO)

## 2023-03-13 DIAGNOSIS — Z0000 Encounter for general adult medical examination without abnormal findings: Secondary | ICD-10-CM

## 2023-03-13 LAB — CBC WITH DIFFERENTIAL/PLATELET
Basophils Absolute: 0 10*3/uL (ref 0.0–0.1)
Basophils Relative: 0.6 % (ref 0.0–3.0)
Eosinophils Absolute: 0 10*3/uL (ref 0.0–0.7)
Eosinophils Relative: 0.7 % (ref 0.0–5.0)
HCT: 39.7 % (ref 36.0–46.0)
Hemoglobin: 13.7 g/dL (ref 12.0–15.0)
Lymphocytes Relative: 26.5 % (ref 12.0–46.0)
Lymphs Abs: 1.4 10*3/uL (ref 0.7–4.0)
MCHC: 34.6 g/dL (ref 30.0–36.0)
MCV: 86.3 fl (ref 78.0–100.0)
Monocytes Absolute: 0.5 10*3/uL (ref 0.1–1.0)
Monocytes Relative: 8.4 % (ref 3.0–12.0)
Neutro Abs: 3.4 10*3/uL (ref 1.4–7.7)
Neutrophils Relative %: 63.8 % (ref 43.0–77.0)
Platelets: 185 10*3/uL (ref 150.0–400.0)
RBC: 4.59 Mil/uL (ref 3.87–5.11)
RDW: 12.9 % (ref 11.5–15.5)
WBC: 5.3 10*3/uL (ref 4.0–10.5)

## 2023-03-13 LAB — COMPREHENSIVE METABOLIC PANEL
ALT: 13 U/L (ref 0–35)
AST: 19 U/L (ref 0–37)
Albumin: 4.3 g/dL (ref 3.5–5.2)
Alkaline Phosphatase: 58 U/L (ref 39–117)
BUN: 10 mg/dL (ref 6–23)
CO2: 30 mEq/L (ref 19–32)
Calcium: 9.3 mg/dL (ref 8.4–10.5)
Chloride: 101 mEq/L (ref 96–112)
Creatinine, Ser: 0.72 mg/dL (ref 0.40–1.20)
GFR: 87.28 mL/min (ref >60.00)
Glucose, Bld: 75 mg/dL (ref 70–99)
Potassium: 4 mEq/L (ref 3.5–5.1)
Sodium: 139 mEq/L (ref 135–145)
Total Bilirubin: 0.7 mg/dL (ref 0.2–1.2)
Total Protein: 7 g/dL (ref 6.0–8.3)

## 2023-03-13 LAB — LIPID PANEL
Cholesterol: 183 mg/dL (ref 0–200)
HDL: 81.3 mg/dL (ref >39.00)
LDL Cholesterol: 89 mg/dL (ref 0–99)
NonHDL: 101.2
Total CHOL/HDL Ratio: 2
Triglycerides: 62 mg/dL (ref 0.0–149.0)
VLDL: 12.4 mg/dL (ref 0.0–40.0)

## 2023-03-13 LAB — TSH: TSH: 0.85 u[IU]/mL (ref 0.35–5.50)

## 2023-03-15 ENCOUNTER — Encounter: Payer: Self-pay | Admitting: Internal Medicine

## 2023-03-25 ENCOUNTER — Other Ambulatory Visit: Payer: Self-pay | Admitting: Internal Medicine

## 2023-03-25 DIAGNOSIS — Z1231 Encounter for screening mammogram for malignant neoplasm of breast: Secondary | ICD-10-CM

## 2023-05-20 ENCOUNTER — Ambulatory Visit
Admission: RE | Admit: 2023-05-20 | Discharge: 2023-05-20 | Disposition: A | Discharge status: Home / Self Care | Payer: Managed Care, Other (non HMO) | Source: Ambulatory Visit | Attending: Internal Medicine | Admitting: Internal Medicine

## 2023-05-20 DIAGNOSIS — Z1231 Encounter for screening mammogram for malignant neoplasm of breast: Secondary | ICD-10-CM

## 2023-06-26 ENCOUNTER — Other Ambulatory Visit: Payer: Self-pay | Admitting: Oncology

## 2023-06-26 DIAGNOSIS — Z006 Encounter for examination for normal comparison and control in clinical research program: Secondary | ICD-10-CM

## 2023-07-02 ENCOUNTER — Telehealth: Payer: Self-pay | Admitting: Internal Medicine

## 2023-07-02 DIAGNOSIS — Z13220 Encounter for screening for lipoid disorders: Secondary | ICD-10-CM

## 2023-07-02 DIAGNOSIS — R931 Abnormal findings on diagnostic imaging of heart and coronary circulation: Secondary | ICD-10-CM

## 2023-07-02 DIAGNOSIS — E559 Vitamin D deficiency, unspecified: Secondary | ICD-10-CM

## 2023-07-02 NOTE — Telephone Encounter (Signed)
I spoke with patient at a community talk re her Ca score  Contacted her today to review  Would recomm   Call pt to set up for labs at her convenience  Fasting Lipomed panel, Lpa and Apo B     Uric acid Vit D Hgb A1C

## 2023-07-06 NOTE — Telephone Encounter (Signed)
My Chart sent to the pt for a date for her labs. Will follow up with her.

## 2023-07-06 NOTE — Addendum Note (Signed)
Addended by: Bertram Millard on: 07/06/2023 08:18 AM   Modules accepted: Orders

## 2023-07-07 ENCOUNTER — Ambulatory Visit: Payer: Managed Care, Other (non HMO) | Attending: Internal Medicine

## 2023-07-07 DIAGNOSIS — Z13220 Encounter for screening for lipoid disorders: Secondary | ICD-10-CM

## 2023-07-07 DIAGNOSIS — R931 Abnormal findings on diagnostic imaging of heart and coronary circulation: Secondary | ICD-10-CM

## 2023-07-07 DIAGNOSIS — E559 Vitamin D deficiency, unspecified: Secondary | ICD-10-CM

## 2023-07-08 LAB — NMR, LIPOPROFILE
Cholesterol, Total: 200 mg/dL — ABNORMAL HIGH (ref 100–199)
HDL Particle Number: 42 umol/L (ref >=30.5)
HDL-C: 92 mg/dL (ref >39)
LDL Particle Number: 754 nmol/L (ref <1000)
LDL Size: 21.2 nm (ref >20.5)
LDL-C (NIH Calc): 94 mg/dL (ref 0–99)
LP-IR Score: 27 (ref <=45)
Small LDL Particle Number: <90 nmol/L (ref <=527)
Triglycerides: 76 mg/dL (ref 0–149)

## 2023-07-08 LAB — HEMOGLOBIN A1C
Est. average glucose Bld gHb Est-mCnc: 88 mg/dL
Hgb A1c MFr Bld: 4.7 % — ABNORMAL LOW (ref 4.8–5.6)

## 2023-07-08 LAB — VITAMIN D 25 HYDROXY (VIT D DEFICIENCY, FRACTURES): Vit D, 25-Hydroxy: 32.3 ng/mL (ref 30.0–100.0)

## 2023-07-08 LAB — URIC ACID: Uric Acid: 5.1 mg/dL (ref 3.0–7.2)

## 2023-07-08 LAB — APOLIPOPROTEIN B: Apolipoprotein B: 70 mg/dL (ref <90)

## 2023-07-08 LAB — LIPOPROTEIN A (LPA): Lipoprotein (a): 118.8 nmol/L — ABNORMAL HIGH (ref <75.0)

## 2023-07-15 ENCOUNTER — Encounter: Payer: Self-pay | Admitting: Internal Medicine

## 2023-07-16 ENCOUNTER — Encounter: Payer: Self-pay | Admitting: Internal Medicine

## 2023-07-17 ENCOUNTER — Telehealth: Payer: Self-pay

## 2023-07-17 MED ORDER — ROSUVASTATIN CALCIUM 5 MG PO TABS: 2.5000 mg | ORAL_TABLET | Freq: Every day | ORAL | 3 refills | Status: DC

## 2023-07-17 NOTE — Telephone Encounter (Signed)
Per Dr Tenny Craw pt to take Crestor 2.5 mg and needs 6 week OV.

## 2023-07-17 NOTE — Telephone Encounter (Signed)
Dr Tenny Craw is reviewing labs.. just waiting on plan for follow up and meds.

## 2023-07-27 NOTE — Telephone Encounter (Signed)
Left a message for the pt to schedule her OV. Will try again later.

## 2023-08-11 NOTE — Telephone Encounter (Signed)
Offered pt appt for Monday 08/17/23 but the time was not good for her.. will continue to try and work her in to see Dr Tenny Craw.

## 2023-08-13 ENCOUNTER — Encounter: Payer: Self-pay | Admitting: Internal Medicine

## 2023-08-13 NOTE — Telephone Encounter (Signed)
OV 08/25/23  LM for the pt to confirm that she can make this appt.

## 2023-08-25 ENCOUNTER — Ambulatory Visit: Payer: Managed Care, Other (non HMO) | Admitting: Internal Medicine

## 2023-08-28 ENCOUNTER — Encounter: Payer: Self-pay | Admitting: Internal Medicine

## 2023-09-08 ENCOUNTER — Ambulatory Visit: Payer: Managed Care, Other (non HMO) | Attending: Internal Medicine | Admitting: Internal Medicine

## 2023-09-08 VITALS — BP 106/70 | HR 66 | Ht 67.0 in | Wt 119.4 lb

## 2023-09-08 DIAGNOSIS — R931 Abnormal findings on diagnostic imaging of heart and coronary circulation: Secondary | ICD-10-CM

## 2023-09-08 NOTE — Progress Notes (Signed)
Cardiology Office Note   Date:  09/08/2023   ID:  Carol Mata, DOB January 26, 1956, MRN [id number]  PCP:  Tresa Garter, MD  Cardiologist:   Dietrich Pates, MD    Pt presents for eval of CAD and HL     History of Present Illness: Carol Mata is a 67 y.o. female who follows with A Ploknikov  She has a hx of CAD as noted on Ca score CT done in 2021 (score of 115)  I have known patient from the community and have spoken with her about the score and its signficance     I recommended that she get her lipids checked with Lipomed panel This was done in Sept 2024  It showed   LDL was 94 with 754 particles    Lpa was elevated at 118  Apo B was 70  A1C 4.7    Given Ca score and labs I  recomm very low dose statin to lower LDL and plaque stabilize.  She started  Crestor 2.5 mg  with plan for follow up in clinic with labs in future  The pt says she feels OK   She denies CP  Breathing is good She had an episode recently of flashing lights around periphery of visual field   Like aura but no HA   Took motrin, symptoms resolved   She did say that she had some transient memory issues that evening  Resolved   ? fatigue    She has an appt with A Plotnikov to review tomorrow  Carotid USN ordered   Denies other neurologic symptoms    Did have one night of muscle aching in legs  Not at other times     Current Meds  Medication Sig   aspirin EC 81 MG tablet Take 1 tablet (81 mg total) by mouth daily. (Patient taking differently: Take 81 mg by mouth in the morning and at bedtime.)   Cholecalciferol (VITAMIN D3) 50 MCG (2000 UT) capsule Take 1 capsule (2,000 Units total) by mouth daily.   fluticasone (FLONASE) 50 MCG/ACT nasal spray Place 2 sprays into both nostrils as needed.   levothyroxine (SYNTHROID) 88 MCG tablet Take 1 tablet (88 mcg total) by mouth daily before breakfast. Keep May appt for future refills   rosuvastatin (CRESTOR) 5 MG tablet Take 0.5 tablets (2.5 mg total) by mouth daily.    zolpidem (AMBIEN) 5 MG tablet TAKE ONE TABLET AT BEDTIME AS NEEDED FOR SLEEP     Allergies:   Lactose intolerance (gi) and Other   Past Medical History:  Diagnosis Date   ASCUS (atypical squamous cells of undetermined significance) on Pap smear    Lactose intolerance    Osteoporosis 09/2019   T score -1.7 2014 T score -2.5. 2016 T score -2.2    Sinusitis    Thyroid disease    Hypothyroid   Vertigo     Past Surgical History:  Procedure Laterality Date   BUNIONECTOMY     DILATION AND CURETTAGE OF UTERUS     HYSTEROSCOPY  3295,1884   NASAL SINUS SURGERY     PELVIC LAPAROSCOPY     DL-Rt salpingectomy     Social History:  The patient  reports that she has quit smoking. She has never used smokeless tobacco. She reports current alcohol use of about 7.0 standard drinks of alcohol per week. She reports that she does not use drugs.   Family History:  The patient's family history includes Cancer in her  father and maternal aunt; Heart disease in her father; Hypertension in her father; Thyroid disease in her sister.    ROS:  Please see the history of present illness. All other systems are reviewed and  Negative to the above problem except as noted.    PHYSICAL EXAM: VS:  BP 106/70   Pulse 66   Ht 5\' 7"  (1.702 m)   Wt 119 lb 6.4 oz (54.2 kg)   SpO2 97%   BMI 18.70 kg/m   GEN: Well nourished, well developed, in no acute distress  HEENT: normal  Neck: no JVD, carotid bruits, Cardiac: RRR; no murmurs,  no LE edema  Respiratory:  clear to auscultation bilaterally,  GI: soft, nontender,No hepatomegaly  MS: no deformity Moving all extremities   Skin: warm and dry, no rash Neuro:  Strength and sensation are intact  CN II to XII intact   Psych: euthymic mood, full affect   EKG:  EKG is ordered today. NSR 66 bpm    Lipid Panel    Component Value Date/Time   CHOL 183 03/13/2023 0731   TRIG 62.0 03/13/2023 0731   HDL 81.30 03/13/2023 0731   CHOLHDL 2 03/13/2023 0731   VLDL  12.4 03/13/2023 0731   LDLCALC 89 03/13/2023 0731   LDLCALC 73 05/30/2020 0905      Wt Readings from Last 3 Encounters:  09/08/23 119 lb 6.4 oz (54.2 kg)  03/12/23 113 lb (51.3 kg)  02/12/22 113 lb (51.3 kg)      ASSESSMENT AND PLAN:  1 Lipids   Pt lipids in Sept 2024 LDL 94 with 754 particles, few small particles   Lpa elevated 119  With calcified plaque noted on Ca score CT, recomm very low dose statin 2.5 mg Crestor    Will get lipomed today with liver panel   2  CAD   Mild calcification on CT scan   Asymptomatic.   Risk factor modify  3  Neuro   Visual symptoms (aura)  Transient memory problem that evening. Appt with A Plotnikov  4    Current medicines are reviewed at length with the patient today.  The patient does not have concerns regarding medicines.  Signed, Dietrich Pates, MD  09/08/2023 2:54 PM    [facility] [address] [place], [place], [place]  [address] Phone: [phone]; Fax: [phone]

## 2023-09-08 NOTE — Patient Instructions (Signed)
Medication Instructions:   *If you need a refill on your cardiac medications before your next appointment, please call your pharmacy*   Lab Work: Nmr, hepatic, vit d  If you have labs (blood work) drawn today and your tests are completely normal, you will receive your results only by: MyChart Message (if you have MyChart) OR A paper copy in the mail If you have any lab test that is abnormal or we need to change your treatment, we will call you to review the results.   Testing/Procedures:    Follow-Up: At [facility], you and your health needs are our priority.  As part of our continuing mission to provide you with exceptional heart care, we have created designated Provider Care Teams.  These Care Teams include your primary Cardiologist (physician) and Advanced Practice Providers (APPs -  Physician Assistants and Nurse Practitioners) who all work together to provide you with the care you need, when you need it.  We recommend signing up for the patient portal called "MyChart".  Sign up information is provided on this After Visit Summary.  MyChart is used to connect with patients for Virtual Visits (Telemedicine).  Patients are able to view lab/test results, encounter notes, upcoming appointments, etc.  Non-urgent messages can be sent to your provider as well.   To learn more about what you can do with MyChart, go to [email or web address].

## 2023-09-09 ENCOUNTER — Other Ambulatory Visit: Payer: Self-pay

## 2023-09-09 ENCOUNTER — Ambulatory Visit: Payer: Managed Care, Other (non HMO) | Admitting: Internal Medicine

## 2023-09-09 ENCOUNTER — Encounter: Payer: Self-pay | Admitting: Internal Medicine

## 2023-09-09 VITALS — BP 122/60 | HR 72 | Temp 98.0°F | Ht 67.0 in | Wt 118.0 lb

## 2023-09-09 DIAGNOSIS — R4701 Aphasia: Secondary | ICD-10-CM

## 2023-09-09 LAB — HEPATIC FUNCTION PANEL
ALT: 17 [IU]/L (ref 0–32)
AST: 29 [IU]/L (ref 0–40)
Albumin: 4.3 g/dL (ref 3.9–4.9)
Alkaline Phosphatase: 70 [IU]/L (ref 44–121)
Bilirubin Total: 0.6 mg/dL (ref 0.0–1.2)
Bilirubin, Direct: 0.23 mg/dL (ref 0.00–0.40)
Total Protein: 6.6 g/dL (ref 6.0–8.5)

## 2023-09-09 LAB — NMR, LIPOPROFILE
Cholesterol, Total: 171 mg/dL (ref 100–199)
HDL Particle Number: 40.4 umol/L (ref >=30.5)
HDL-C: 92 mg/dL (ref >39)
LDL Particle Number: 552 nmol/L (ref <1000)
LDL Size: 21.3 nm (ref >20.5)
LDL-C (NIH Calc): 69 mg/dL (ref 0–99)
LP-IR Score: <25 (ref <=45)
Small LDL Particle Number: <90 nmol/L (ref <=527)
Triglycerides: 45 mg/dL (ref 0–149)

## 2023-09-09 LAB — VITAMIN D 25 HYDROXY (VIT D DEFICIENCY, FRACTURES): Vit D, 25-Hydroxy: 35.1 ng/mL (ref 30.0–100.0)

## 2023-09-09 MED ORDER — SM VITAMIN D3 100 MCG (4000 UT) PO CAPS: 4000.0000 [IU] | ORAL_CAPSULE | Freq: Every day | ORAL | Status: AC

## 2023-09-09 NOTE — Progress Notes (Signed)
Subjective:  Patient ID: Carol Mata, female    DOB: [birth year]  Age: 67 y.o. MRN: [id number]  CC: No chief complaint on file.   HPI Carol Mata presents for Oct 24 stressful day - started to see aura at 7 pm - it lasted for 15 min. It was very bad. Came home at 9 pm. There was a problem with word recall. Dull HA lasted for 4 days    Outpatient Medications Prior to Visit  Medication Sig Dispense Refill   fluticasone (FLONASE) 50 MCG/ACT nasal spray Place 2 sprays into both nostrils as needed. 16 g 4   levothyroxine (SYNTHROID) 88 MCG tablet Take 1 tablet (88 mcg total) by mouth daily before breakfast. Keep May appt for future refills 90 tablet 3   rosuvastatin (CRESTOR) 5 MG tablet Take 0.5 tablets (2.5 mg total) by mouth daily. 45 tablet 3   zolpidem (AMBIEN) 5 MG tablet TAKE ONE TABLET AT BEDTIME AS NEEDED FOR SLEEP 30 tablet 3   aspirin EC 81 MG tablet Take 1 tablet (81 mg total) by mouth daily. (Patient taking differently: Take 81 mg by mouth in the morning and at bedtime.) 100 tablet 3   Cholecalciferol (VITAMIN D3) 50 MCG (2000 UT) capsule Take 1 capsule (2,000 Units total) by mouth daily. 100 capsule 3   No facility-administered medications prior to visit.    ROS: Review of Systems  Objective:  BP 122/60 (BP Location: Left Arm, Patient Position: Sitting, Cuff Size: Normal)   Pulse 72   Temp 98 F (36.7 C) (Oral)   Ht 5\' 7"  (1.702 m)   Wt 118 lb (53.5 kg)   SpO2 98%   BMI 18.48 kg/m   BP Readings from Last 3 Encounters:  09/09/23 122/60  09/08/23 106/70  03/12/23 118/62    Wt Readings from Last 3 Encounters:  09/09/23 118 lb (53.5 kg)  09/08/23 119 lb 6.4 oz (54.2 kg)  03/12/23 113 lb (51.3 kg)    Physical Exam  Lab Results  Component Value Date   WBC 5.3 03/13/2023   HGB 13.7 03/13/2023   HCT 39.7 03/13/2023   PLT 185.0 03/13/2023   GLUCOSE 75 03/13/2023   CHOL 183 03/13/2023   TRIG 62.0 03/13/2023   HDL 81.30 03/13/2023   LDLCALC 89  03/13/2023   ALT 17 09/08/2023   AST 29 09/08/2023   NA 139 03/13/2023   K 4.0 03/13/2023   CL 101 03/13/2023   CREATININE 0.72 03/13/2023   BUN 10 03/13/2023   CO2 30 03/13/2023   TSH 0.85 03/13/2023   HGBA1C 4.7 (L) 07/07/2023    MM 3D SCREENING MAMMOGRAM BILATERAL BREAST  Result Date: 05/21/2023 CLINICAL DATA:  Screening. EXAM: DIGITAL SCREENING BILATERAL MAMMOGRAM WITH TOMOSYNTHESIS AND CAD TECHNIQUE: Bilateral screening digital craniocaudal and mediolateral oblique mammograms were obtained. Bilateral screening digital breast tomosynthesis was performed. The images were evaluated with computer-aided detection. COMPARISON:  Previous exam(s). ACR Breast Density Category d: The breasts are extremely dense, which lowers the sensitivity of mammography. FINDINGS: There are no findings suspicious for malignancy. IMPRESSION: No mammographic evidence of malignancy. A result letter of this screening mammogram will be mailed directly to the patient. RECOMMENDATION: Screening mammogram in one year. (Code:SM-B-01Y) BI-RADS CATEGORY  1: Negative. Electronically Signed   By: Amie Portland M.D.   On: 05/21/2023 13:37    Assessment & Plan:   Problem List Items Addressed This Visit   None     No orders of the defined types were  placed in this encounter.     Follow-up: No follow-ups on file.  Sonda Primes, MD

## 2023-09-09 NOTE — Assessment & Plan Note (Signed)
Head CT Carotid Doppler

## 2023-09-16 ENCOUNTER — Encounter: Payer: Self-pay | Admitting: Internal Medicine

## 2023-09-18 ENCOUNTER — Encounter: Payer: Self-pay | Admitting: Internal Medicine

## 2023-09-18 NOTE — Telephone Encounter (Signed)
Pt has reached out and asked if there is any update on getting her Ct Scheduled ?

## 2023-09-21 NOTE — Addendum Note (Signed)
Addended by: Delsa Grana R on: 09/21/2023 12:05 PM   Modules accepted: Orders

## 2023-09-21 NOTE — Addendum Note (Signed)
Addended by: Delsa Grana R on: 09/21/2023 11:52 AM   Modules accepted: Orders

## 2023-09-30 ENCOUNTER — Encounter: Payer: Self-pay | Admitting: Internal Medicine

## 2023-10-05 ENCOUNTER — Ambulatory Visit (HOSPITAL_COMMUNITY)
Admission: RE | Admit: 2023-10-05 | Payer: Managed Care, Other (non HMO) | Source: Ambulatory Visit | Attending: Internal Medicine | Admitting: Internal Medicine

## 2023-10-12 ENCOUNTER — Ambulatory Visit (HOSPITAL_COMMUNITY)
Admission: RE | Admit: 2023-10-12 | Discharge: 2023-10-12 | Disposition: A | Discharge status: Home / Self Care | Payer: Managed Care, Other (non HMO) | Source: Ambulatory Visit | Attending: Cardiovascular Disease | Admitting: Cardiovascular Disease

## 2023-10-12 DIAGNOSIS — R4701 Aphasia: Secondary | ICD-10-CM | POA: Insufficient documentation

## 2023-10-16 ENCOUNTER — Encounter: Payer: Self-pay | Admitting: Internal Medicine

## 2023-10-16 DIAGNOSIS — I6529 Occlusion and stenosis of unspecified carotid artery: Secondary | ICD-10-CM | POA: Insufficient documentation

## 2023-10-26 ENCOUNTER — Other Ambulatory Visit: Payer: Self-pay

## 2023-10-26 MED ORDER — ROSUVASTATIN CALCIUM 5 MG PO TABS: 2.5000 mg | ORAL_TABLET | Freq: Every day | ORAL | 3 refills | Status: DC

## 2023-11-20 ENCOUNTER — Encounter: Payer: Self-pay | Admitting: Internal Medicine

## 2023-11-20 ENCOUNTER — Other Ambulatory Visit: Payer: Self-pay

## 2023-11-20 MED ORDER — LEVOTHYROXINE SODIUM 88 MCG PO TABS: 88.0000 ug | ORAL_TABLET | Freq: Every day | ORAL | 3 refills | Status: AC

## 2023-11-20 MED ORDER — FLUTICASONE PROPIONATE 50 MCG/ACT NA SUSP: 2.0000 | NASAL | 4 refills | Status: AC | PRN

## 2023-11-23 ENCOUNTER — Ambulatory Visit (INDEPENDENT_AMBULATORY_CARE_PROVIDER_SITE_OTHER): Payer: Managed Care, Other (non HMO) | Admitting: Obstetrics & Gynecology

## 2023-11-23 ENCOUNTER — Encounter: Payer: Self-pay | Admitting: Obstetrics & Gynecology

## 2023-11-23 VITALS — BP 115/75 | HR 73 | Ht 67.0 in | Wt 114.0 lb

## 2023-11-23 DIAGNOSIS — Z780 Asymptomatic menopausal state: Secondary | ICD-10-CM | POA: Diagnosis not present

## 2023-11-23 DIAGNOSIS — M8580 Other specified disorders of bone density and structure, unspecified site: Secondary | ICD-10-CM

## 2023-11-23 DIAGNOSIS — Z01419 Encounter for gynecological examination (general) (routine) without abnormal findings: Secondary | ICD-10-CM | POA: Diagnosis not present

## 2023-11-23 NOTE — Progress Notes (Signed)
    Subjective:     Carol Mata is a 68 y.o. female here for a routine exam.  Current complaints: just started a statin for elevated cholesterol.  No signs and symptoms of ovarain cancer.     Gynecologic History No LMP recorded. Patient is postmenopausal. Contraception: post menopausal status Last Mammogram: 05/21/23- neg Last Pap Smear:  12/02/21- negative Last Colon Screening;  2022 Seat Belts:   yes Sun Screen:   yes Dental Check Up:  yes Brush & Floss:  yes   Obstetric History OB History  Gravida Para Term Preterm AB Living  4 3 3  1 3   SAB IAB Ectopic Multiple Live Births  1        # Outcome Date GA Lbr Len/2nd Weight Sex Type Anes PTL Lv  4 SAB           3 Term           2 Term           1 Term              The following portions of the patient's history were reviewed and updated as appropriate: allergies, current medications, past family history, past medical history, past social history, past surgical history, and problem list.  Review of Systems Pertinent items noted in HPI and remainder of comprehensive ROS otherwise negative.    Objective:     Vitals:   11/23/23 0845  BP: 115/75  Pulse: 73  Weight: 114 lb (51.7 kg)  Height: 5\' 7"  (1.702 m)   Vitals:  WNL General appearance: alert, cooperative and no distress  HEENT: Normocephalic, without obvious abnormality, atraumatic Eyes: negative Throat: lips, mucosa, and tongue normal; teeth and gums normal  Respiratory: Clear to auscultation bilaterally  CV: Regular rate and rhythm  Breasts:  Normal appearance, no masses or tenderness, no nipple retraction or dimpling  GI: Soft, non-tender; bowel sounds normal; no masses,  no organomegaly  GU: External Genitalia:  Tanner V, no lesion Urethra:  No prolapse   Vagina: Pink, normal rugae, no blood or discharge  Cervix: No CMT, no lesion  Uterus:  Normal size and contour, non tender  Adnexa: Normal, no masses, non tender  Musculoskeletal: No edema, redness  or tenderness in the calves or thighs  Skin: No lesions or rash  Lymphatic: Axillary adenopathy: none     Psychiatric: Normal mood and behavior        Assessment:    Healthy female exam.    Plan:    Osteopenia--maximize calcium and vitamin D intake, weight bearing exercise, strngth training; rpt Dexa Nov 2025.  Up to date on pap Yearly mammograms Colon cancer screening up to date.

## 2023-11-24 ENCOUNTER — Encounter: Payer: Self-pay | Admitting: Internal Medicine

## 2023-11-24 ENCOUNTER — Encounter: Payer: Self-pay | Admitting: Obstetrics & Gynecology

## 2023-11-24 NOTE — Progress Notes (Signed)
Last colonoscopy 11/20

## 2023-12-03 ENCOUNTER — Encounter: Payer: Self-pay | Admitting: Obstetrics & Gynecology

## 2023-12-07 ENCOUNTER — Encounter: Payer: Self-pay | Admitting: Obstetrics & Gynecology

## 2024-03-30 ENCOUNTER — Encounter: Payer: Self-pay | Admitting: Internal Medicine

## 2024-03-30 ENCOUNTER — Ambulatory Visit (INDEPENDENT_AMBULATORY_CARE_PROVIDER_SITE_OTHER): Payer: Managed Care, Other (non HMO) | Admitting: Internal Medicine

## 2024-03-30 VITALS — BP 102/70 | HR 63 | Temp 97.8°F | Ht 67.0 in | Wt 113.1 lb

## 2024-03-30 DIAGNOSIS — E559 Vitamin D deficiency, unspecified: Secondary | ICD-10-CM | POA: Diagnosis not present

## 2024-03-30 DIAGNOSIS — E039 Hypothyroidism, unspecified: Secondary | ICD-10-CM | POA: Diagnosis not present

## 2024-03-30 DIAGNOSIS — Z0000 Encounter for general adult medical examination without abnormal findings: Secondary | ICD-10-CM | POA: Diagnosis not present

## 2024-03-30 DIAGNOSIS — R42 Dizziness and giddiness: Secondary | ICD-10-CM

## 2024-03-30 LAB — CBC WITH DIFFERENTIAL/PLATELET
Basophils Absolute: 0 10*3/uL (ref 0.0–0.1)
Basophils Relative: 0.5 % (ref 0.0–3.0)
Eosinophils Absolute: 0 10*3/uL (ref 0.0–0.7)
Eosinophils Relative: 0.9 % (ref 0.0–5.0)
HCT: 38.2 % (ref 36.0–46.0)
Hemoglobin: 13 g/dL (ref 12.0–15.0)
Lymphocytes Relative: 25 % (ref 12.0–46.0)
Lymphs Abs: 1.4 10*3/uL (ref 0.7–4.0)
MCHC: 34.1 g/dL (ref 30.0–36.0)
MCV: 87.2 fl (ref 78.0–100.0)
Monocytes Absolute: 0.4 10*3/uL (ref 0.1–1.0)
Monocytes Relative: 8.1 % (ref 3.0–12.0)
Neutro Abs: 3.5 10*3/uL (ref 1.4–7.7)
Neutrophils Relative %: 65.5 % (ref 43.0–77.0)
Platelets: 171 10*3/uL (ref 150.0–400.0)
RBC: 4.38 Mil/uL (ref 3.87–5.11)
RDW: 12.8 % (ref 11.5–15.5)
WBC: 5.4 10*3/uL (ref 4.0–10.5)

## 2024-03-30 LAB — URINALYSIS, ROUTINE W REFLEX MICROSCOPIC
Bilirubin Urine: NEGATIVE
Ketones, ur: NEGATIVE
Nitrite: NEGATIVE
Specific Gravity, Urine: 1.02 (ref 1.000–1.030)
Total Protein, Urine: NEGATIVE
Urine Glucose: NEGATIVE
Urobilinogen, UA: 0.2 (ref 0.0–1.0)
pH: 6 (ref 5.0–8.0)

## 2024-03-30 LAB — LIPID PANEL
Cholesterol: 161 mg/dL (ref 0–200)
HDL: 79.2 mg/dL (ref >39.00)
LDL Cholesterol: 69 mg/dL (ref 0–99)
NonHDL: 81.58
Total CHOL/HDL Ratio: 2
Triglycerides: 61 mg/dL (ref 0.0–149.0)
VLDL: 12.2 mg/dL (ref 0.0–40.0)

## 2024-03-30 LAB — TSH: TSH: 1.79 u[IU]/mL (ref 0.35–5.50)

## 2024-03-30 LAB — VITAMIN B12: Vitamin B-12: 240 pg/mL (ref 211–911)

## 2024-03-30 LAB — COMPREHENSIVE METABOLIC PANEL WITH GFR
ALT: 19 U/L (ref 0–35)
AST: 24 U/L (ref 0–37)
Albumin: 4.3 g/dL (ref 3.5–5.2)
Alkaline Phosphatase: 61 U/L (ref 39–117)
BUN: 14 mg/dL (ref 6–23)
CO2: 31 meq/L (ref 19–32)
Calcium: 9.4 mg/dL (ref 8.4–10.5)
Chloride: 102 meq/L (ref 96–112)
Creatinine, Ser: 0.7 mg/dL (ref 0.40–1.20)
GFR: 89.62 mL/min (ref >60.00)
Glucose, Bld: 81 mg/dL (ref 70–99)
Potassium: 4.4 meq/L (ref 3.5–5.1)
Sodium: 138 meq/L (ref 135–145)
Total Bilirubin: 0.9 mg/dL (ref 0.2–1.2)
Total Protein: 6.8 g/dL (ref 6.0–8.3)

## 2024-03-30 LAB — VITAMIN D 25 HYDROXY (VIT D DEFICIENCY, FRACTURES): VITD: 52.08 ng/mL (ref 30.00–100.00)

## 2024-03-30 LAB — HEMOGLOBIN A1C: Hgb A1c MFr Bld: 4.9 % (ref 4.6–6.5)

## 2024-03-30 MED ORDER — ZOLPIDEM TARTRATE 5 MG PO TABS: ORAL_TABLET | ORAL | 3 refills | Status: DC

## 2024-03-30 NOTE — Assessment & Plan Note (Signed)
 We discussed age appropriate health related issues, including available/recomended screening tests and vaccinations. We discussed a need for adhering to healthy diet and exercise. Labs were ordered to be later reviewed . All questions were answered. A cardiac CT scan for coronary calcium  score 115 - 2021 Colon 2015, 2020 nl Dr Andriette Keeling, due in 2030

## 2024-03-30 NOTE — Assessment & Plan Note (Signed)
Carotid bruit, mild B

## 2024-03-30 NOTE — Progress Notes (Signed)
 Subjective:  Patient ID: Carol Mata, female    DOB: 1956/01/19  Age: 68 y.o. MRN: [id number]  CC: Annual Exam   HPI Carol Mata presents for a well exam Mother died on June 03, 2025th  Outpatient Medications Prior to Visit  Medication Sig Dispense Refill   Cholecalciferol (SM VITAMIN D3) 100 MCG (4000 UT) CAPS Take 1 capsule (4,000 Units total) by mouth daily in the afternoon.     fluticasone  (FLONASE ) 50 MCG/ACT nasal spray Place 2 sprays into both nostrils as needed. 16 g 4   levothyroxine  (SYNTHROID ) 88 MCG tablet Take 1 tablet (88 mcg total) by mouth daily before breakfast. Keep May appt for future refills 90 tablet 3   rosuvastatin  (CRESTOR ) 5 MG tablet Take 0.5 tablets (2.5 mg total) by mouth daily. 45 tablet 3   zolpidem  (AMBIEN ) 5 MG tablet TAKE ONE TABLET AT BEDTIME AS NEEDED FOR SLEEP 30 tablet 3   No facility-administered medications prior to visit.    ROS: Review of Systems  Constitutional:  Negative for activity change, appetite change, chills, fatigue and unexpected weight change.  HENT:  Negative for congestion, mouth sores and sinus pressure.   Eyes:  Negative for visual disturbance.  Respiratory:  Negative for cough and chest tightness.   Gastrointestinal:  Negative for abdominal pain and nausea.  Genitourinary:  Negative for difficulty urinating, frequency and vaginal pain.  Musculoskeletal:  Negative for back pain and gait problem.  Skin:  Negative for pallor and rash.  Neurological:  Negative for dizziness, tremors, weakness, numbness and headaches.  Psychiatric/Behavioral:  Positive for sleep disturbance. Negative for confusion and suicidal ideas. The patient is not nervous/anxious.     Objective:  BP 102/70   Pulse 63   Temp 97.8 F (36.6 C) (Oral)   Ht 5\' 7"  (1.702 m)   Wt 113 lb 2 oz (51.3 kg)   SpO2 98%   BMI 17.72 kg/m   BP Readings from Last 3 Encounters:  03/30/24 102/70  11/23/23 115/75  09/09/23 122/60    Wt Readings from Last 3  Encounters:  03/30/24 113 lb 2 oz (51.3 kg)  11/23/23 114 lb (51.7 kg)  09/09/23 118 lb (53.5 kg)    Physical Exam Constitutional:      General: She is not in acute distress.    Appearance: Normal appearance. She is well-developed.  HENT:     Head: Normocephalic.     Right Ear: External ear normal.     Left Ear: External ear normal.     Nose: Nose normal.  Eyes:     General:        Right eye: No discharge.        Left eye: No discharge.     Conjunctiva/sclera: Conjunctivae normal.     Pupils: Pupils are equal, round, and reactive to light.  Neck:     Thyroid : No thyromegaly.     Vascular: No JVD.     Trachea: No tracheal deviation.  Cardiovascular:     Rate and Rhythm: Normal rate and regular rhythm.     Heart sounds: Normal heart sounds.  Pulmonary:     Effort: No respiratory distress.     Breath sounds: No stridor. No wheezing.  Abdominal:     General: Bowel sounds are normal. There is no distension.     Palpations: Abdomen is soft. There is no mass.     Tenderness: There is no abdominal tenderness. There is no guarding or rebound.  Musculoskeletal:  General: No tenderness.     Cervical back: Normal range of motion and neck supple. No rigidity.  Lymphadenopathy:     Cervical: No cervical adenopathy.  Skin:    Findings: No erythema or rash.  Neurological:     Cranial Nerves: No cranial nerve deficit.     Motor: No abnormal muscle tone.     Coordination: Coordination normal.     Gait: Gait normal.     Deep Tendon Reflexes: Reflexes normal.  Psychiatric:        Behavior: Behavior normal.        Thought Content: Thought content normal.        Judgment: Judgment normal.     Lab Results  Component Value Date   WBC 5.3 03/13/2023   HGB 13.7 03/13/2023   HCT 39.7 03/13/2023   PLT 185.0 03/13/2023   GLUCOSE 75 03/13/2023   CHOL 183 03/13/2023   TRIG 62.0 03/13/2023   HDL 81.30 03/13/2023   LDLCALC 89 03/13/2023   ALT 17 09/08/2023   AST 29 09/08/2023    NA 139 03/13/2023   K 4.0 03/13/2023   CL 101 03/13/2023   CREATININE 0.72 03/13/2023   BUN 10 03/13/2023   CO2 30 03/13/2023   TSH 0.85 03/13/2023   HGBA1C 4.7 (L) 07/07/2023    VAS US  CAROTID Result Date: 10/13/2023 Carotid Arterial Duplex Study Patient Name:  Carol Mata  Date of Exam:   10/12/2023 Medical Rec #: [id number]         Accession #:    [id number] Date of Birth: Aug 23, 1956         Patient Gender: F Patient Age:   74 years Exam Location:  [facility] Procedure:      VAS US  CAROTID Referring Phys: Carol Mata --------------------------------------------------------------------------------  Indications:       Patient reports having a h/o auras with the last episode                    lasting about 30 minutes accompanied with aphasia. This                    episode happened on August 20, 2023. She reports occasional                    blurred vision. She denies any other cerebrovascular                    symptoms. Risk Factors:      Hyperlipidemia, past history of smoking, coronary artery                    disease. Other Factors:     Covid-19. Comparison Study:  NA Performing Technologist: Doren Gammons RVT  Examination Guidelines: A complete evaluation includes B-mode imaging, spectral Doppler, color Doppler, and power Doppler as needed of all accessible portions of each vessel. Bilateral testing is considered an integral part of a complete examination. Limited examinations for reoccurring indications may be performed as noted.  Right Carotid Findings: +----------+--------+--------+--------+----------------------+--------+           PSV cm/sEDV cm/sStenosisPlaque Description    Comments +----------+--------+--------+--------+----------------------+--------+ CCA Prox  107     21                                             +----------+--------+--------+--------+----------------------+--------+ CCA  Distal77      24                                              +----------+--------+--------+--------+----------------------+--------+ ICA Prox  75      20      1-39%   heterogenous and focal         +----------+--------+--------+--------+----------------------+--------+ ICA Distal89      29                                    tortuous +----------+--------+--------+--------+----------------------+--------+ ECA       81      11                                             +----------+--------+--------+--------+----------------------+--------+ +----------+--------+-------+----------------+-------------------+           PSV cm/sEDV cmsDescribe        Arm Pressure (mmHG) +----------+--------+-------+----------------+-------------------+ [id number]            Multiphasic, [id number]                 +----------+--------+-------+----------------+-------------------+ +---------+--------+--+--------+--+---------+ VertebralPSV cm/s43EDV cm/s12Antegrade +---------+--------+--+--------+--+---------+  Left Carotid Findings: +----------+--------+--------+--------+----------------------+----------+           PSV cm/sEDV cm/sStenosisPlaque Description    Comments   +----------+--------+--------+--------+----------------------+----------+ CCA Prox  100     25                                               +----------+--------+--------+--------+----------------------+----------+ CCA Distal82      24                                               +----------+--------+--------+--------+----------------------+----------+ ICA Prox  66      17      1-39%   heterogenous and focal           +----------+--------+--------+--------+----------------------+----------+ ICA Distal87      34                                    steep dive +----------+--------+--------+--------+----------------------+----------+ ECA       80      12                                                +----------+--------+--------+--------+----------------------+----------+ +----------+--------+--------+----------------+-------------------+           PSV cm/sEDV cm/sDescribe        Arm Pressure (mmHG) +----------+--------+--------+----------------+-------------------+ [id number]             Multiphasic, [id number]                 +----------+--------+--------+----------------+-------------------+ +---------+--------+--+--------+--+---------+ VertebralPSV cm/s71EDV cm/s20Antegrade +---------+--------+--+--------+--+---------+   Summary: Right Carotid: Velocities in the right ICA are consistent with  a 1-39% stenosis. Left Carotid: Velocities in the left ICA are consistent with a 1-39% stenosis. Vertebrals:  Bilateral vertebral arteries demonstrate antegrade flow. Subclavians: Normal flow hemodynamics were seen in bilateral subclavian              arteries. *See table(s) above for measurements and observations.  Electronically signed by Armida Lander MD on 10/13/2023 at 2:04:28 PM.    Final     Assessment & Plan:   Problem List Items Addressed This Visit     Hypothyroidism   Vertigo   Relevant Orders   TSH   Hemoglobin A1c   Vitamin B12   VITAMIN D  25 Hydroxy (Vit-D Deficiency, Fractures)   Well adult exam - Primary   We discussed age appropriate health related issues, including available/recomended screening tests and vaccinations. We discussed a need for adhering to healthy diet and exercise. Labs were ordered to be later reviewed . All questions were answered. A cardiac CT scan for coronary calcium  score 115 - 2021 Colon 2015, 2020 nl Dr Andriette Keeling, due in 2030      Relevant Orders   TSH   CBC with Differential/Platelet   Urinalysis   Lipid panel   Comprehensive metabolic panel with GFR   Hemoglobin A1c   Vitamin B12   VITAMIN D  25 Hydroxy (Vit-D Deficiency, Fractures)   Vitamin D  deficiency   Carotid bruit, mild B          Meds ordered this encounter   Medications   zolpidem  (AMBIEN ) 5 MG tablet    Sig: TAKE ONE TABLET AT BEDTIME AS NEEDED FOR SLEEP    Dispense:  30 tablet    Refill:  3      Follow-up: No follow-ups on file.  Anitra Barn, MD

## 2024-04-03 ENCOUNTER — Ambulatory Visit: Payer: Self-pay | Admitting: Internal Medicine

## 2024-04-03 MED ORDER — VITAMIN B-12 1000 MCG SL SUBL: 1.0000 | SUBLINGUAL_TABLET | SUBLINGUAL | 3 refills | Status: AC

## 2024-04-11 ENCOUNTER — Other Ambulatory Visit: Payer: Self-pay | Admitting: Internal Medicine

## 2024-04-11 DIAGNOSIS — Z1231 Encounter for screening mammogram for malignant neoplasm of breast: Secondary | ICD-10-CM

## 2024-05-20 ENCOUNTER — Ambulatory Visit

## 2024-05-24 ENCOUNTER — Ambulatory Visit
Admission: RE | Admit: 2024-05-24 | Discharge: 2024-05-24 | Disposition: A | Discharge status: Home / Self Care | Source: Ambulatory Visit | Attending: Internal Medicine | Admitting: Internal Medicine

## 2024-05-24 DIAGNOSIS — Z1231 Encounter for screening mammogram for malignant neoplasm of breast: Secondary | ICD-10-CM

## 2024-05-31 ENCOUNTER — Encounter: Payer: Self-pay | Admitting: Internal Medicine

## 2024-06-02 ENCOUNTER — Encounter: Payer: Self-pay | Admitting: Internal Medicine

## 2024-08-24 ENCOUNTER — Other Ambulatory Visit: Payer: Self-pay | Admitting: Medical Genetics

## 2024-08-24 DIAGNOSIS — Z006 Encounter for examination for normal comparison and control in clinical research program: Secondary | ICD-10-CM

## 2024-09-02 ENCOUNTER — Ambulatory Visit: Admitting: Internal Medicine

## 2024-09-02 ENCOUNTER — Encounter: Payer: Self-pay | Admitting: Internal Medicine

## 2024-09-02 VITALS — BP 106/80 | HR 63 | Temp 98.3°F | Ht 67.0 in | Wt 112.0 lb

## 2024-09-02 DIAGNOSIS — H6121 Impacted cerumen, right ear: Secondary | ICD-10-CM | POA: Diagnosis not present

## 2024-09-02 DIAGNOSIS — H9191 Unspecified hearing loss, right ear: Secondary | ICD-10-CM

## 2024-09-02 NOTE — Progress Notes (Signed)
 Subjective:    Patient ID: Carol Mata, female    DOB: Mar 10, 1956, 68 y.o.   MRN: [id number]      HPI Carol Mata is here for  Chief Complaint  Patient presents with   Cerumen Impaction    Right ear wax   Discussed the use of AI scribe software for clinical note transcription with the patient, who gave verbal consent to proceed.  History of Present Illness Carol Mata is a 68 year old female who presents with ear congestion and decreased hearing.  She has experienced ear congestion and decreased hearing in one ear for several days without any associated cold or illness symptoms. She reports that a workplace nurse looked in her ear and told her there was a partial occlusion but did not see any infection. She has had similar issues in the past, which were resolved by an ENT using a small scoop to remove earwax.  She has used ear drops without relief. The ear feels 'very clogged' but there is no pain, discomfort, or tinnitus. No nasal congestion, sinus pressure, headaches, or dizziness are reported. Morning nasal stuffiness occurs but resolves upon getting up and is typical for her, unrelated to the current ear issue.  Attempts to relieve congestion by popping ears and blowing her nose have been ineffective. She does not take any allergy medications regularly.        Medications and allergies reviewed with patient and updated if appropriate.  Current Outpatient Medications on File Prior to Visit  Medication Sig Dispense Refill   Cholecalciferol (SM VITAMIN D3) 100 MCG (4000 UT) CAPS Take 1 capsule (4,000 Units total) by mouth daily in the afternoon.     Cyanocobalamin  (VITAMIN B-12) 1000 MCG SUBL Place 1 tablet (1,000 mcg total) under the tongue every other day. 100 tablet 3   fluticasone  (FLONASE ) 50 MCG/ACT nasal spray Place 2 sprays into both nostrils as needed. 16 g 4   levothyroxine  (SYNTHROID ) 88 MCG tablet Take 1 tablet (88 mcg total) by mouth daily before breakfast.  Keep May appt for future refills 90 tablet 3   rosuvastatin  (CRESTOR ) 5 MG tablet Take 0.5 tablets (2.5 mg total) by mouth daily. 45 tablet 3   zolpidem  (AMBIEN ) 5 MG tablet TAKE ONE TABLET AT BEDTIME AS NEEDED FOR SLEEP 30 tablet 3   No current facility-administered medications on file prior to visit.    Review of Systems  Constitutional:  Negative for fever.  HENT:  Positive for hearing loss. Negative for congestion, ear pain, sinus pressure, sinus pain and tinnitus.        Right ear clogged  Neurological:  Negative for dizziness and headaches.       Objective:   Vitals:   09/02/24 0931  BP: 106/80  Pulse: 63  Temp: 98.3 F (36.8 C)  SpO2: 98%   BP Readings from Last 3 Encounters:  09/02/24 106/80  03/30/24 102/70  11/23/23 115/75   Wt Readings from Last 3 Encounters:  09/02/24 112 lb (50.8 kg)  03/30/24 113 lb 2 oz (51.3 kg)  11/23/23 114 lb (51.7 kg)   Body mass index is 17.54 kg/m.    Physical Exam Constitutional:      General: She is not in acute distress.    Appearance: Normal appearance. She is not ill-appearing.  HENT:     Head: Normocephalic and atraumatic.     Right Ear: Tympanic membrane, ear canal and external ear normal. There is impacted cerumen (Excessive cerumen in the  middle ear canal and there is some cerumen lying on top of the tympanic membrane).     Left Ear: Tympanic membrane, ear canal and external ear normal. There is no impacted cerumen.  Musculoskeletal:     Cervical back: Neck supple. No tenderness.  Lymphadenopathy:     Cervical: No cervical adenopathy.  Skin:    General: Skin is warm.  Neurological:     Mental Status: She is alert.        PROCEDURE:  Ear lavage, right ear PROCEDURE INDICATION: remove wax to visualize ear drum & relieve discomfort  CONSENT:  Verbal  PROCEDURE NOTE:   RIGHT EAR: I personally used an ear curette to remove a chunk of wax in the outer-mid ear canal.  We then the CMA irrigated the ear with warm  water to remove the wax that was laying against the tympanic membrane.  99 % of the wax was removed. POST- PROCEDURE EXAM: TM successfully visualized and found to have no erythema, perforation.    Patient tolerated procedure well and did get relief of the discomfort in the ear.  Her hearing returned to normal.      Assessment & Plan:     Assessment and Plan Assessment & Plan Decreased hearing right ear, impacted cerumen Excessive cerumen in ear canal and overlying tympanic membrane resulting in decreased hearing, no infection or pain.  - Used ear curette and flushed ear canal to remove impacted cerumen. -Hearing improved

## 2024-09-02 NOTE — Patient Instructions (Addendum)
     Your right ear was cleaned out.    Return if symptoms worsen or fail to improve.

## 2024-09-17 ENCOUNTER — Telehealth: Admitting: Physician Assistant

## 2024-09-17 DIAGNOSIS — M542 Cervicalgia: Secondary | ICD-10-CM

## 2024-09-17 NOTE — Patient Instructions (Signed)
  Carol Mata, thank you for joining Teena Shuck, PA-C for today's virtual visit.  While this provider is not your primary care provider (PCP), if your PCP is located in our provider database this encounter information will be shared with them immediately following your visit.   A [facility] MyChart account gives you access to today's visit and all your visits, tests, and labs performed at [facility]  click here if you don't have a [facility] MyChart account or go to [email or web address]  Consent: (Patient) Carol Mata provided verbal consent for this virtual visit at the beginning of the encounter.  Current Medications:  Current Outpatient Medications:    Cholecalciferol (SM VITAMIN D3) 100 MCG (4000 UT) CAPS, Take 1 capsule (4,000 Units total) by mouth daily in the afternoon., Disp: , Rfl:    Cyanocobalamin  (VITAMIN B-12) 1000 MCG SUBL, Place 1 tablet (1,000 mcg total) under the tongue every other day., Disp: 100 tablet, Rfl: 3   fluticasone  (FLONASE ) 50 MCG/ACT nasal spray, Place 2 sprays into both nostrils as needed., Disp: 16 g, Rfl: 4   levothyroxine  (SYNTHROID ) 88 MCG tablet, Take 1 tablet (88 mcg total) by mouth daily before breakfast. Keep May appt for future refills, Disp: 90 tablet, Rfl: 3   rosuvastatin  (CRESTOR ) 5 MG tablet, Take 0.5 tablets (2.5 mg total) by mouth daily., Disp: 45 tablet, Rfl: 3   zolpidem  (AMBIEN ) 5 MG tablet, TAKE ONE TABLET AT BEDTIME AS NEEDED FOR SLEEP, Disp: 30 tablet, Rfl: 3   Medications ordered in this encounter:  No orders of the defined types were placed in this encounter.    *If you need refills on other medications prior to your next appointment, please contact your pharmacy*  Follow-Up: Call back or seek an in-person evaluation if the symptoms worsen or if the condition fails to improve as anticipated.  [facility] [phone]  Other Instructions Report to ER for evaluation.    If you have  been instructed to have an in-person evaluation today at a local Urgent Care facility, please use the link below. It will take you to a list of all of our available [facility], including address, phone number and hours of operation. Please do not delay care.  [facility]  If you or a family member do not have a primary care provider, use the link below to schedule a visit and establish care. When you choose a [facility] primary care physician or advanced practice provider, you gain a long-term partner in health. Find a Primary Care Provider  Learn more about [facility] in-office and virtual care options: [facility] - Get Care Now

## 2024-09-17 NOTE — Progress Notes (Signed)
 Virtual Visit Consent   Carol Mata, you are scheduled for a virtual visit with a [facility] provider today. Just as with appointments in the office, your consent must be obtained to participate. Your consent will be active for this visit and any virtual visit you may have with one of our providers in the next 365 days. If you have a MyChart account, a copy of this consent can be sent to you electronically.  As this is a virtual visit, video technology does not allow for your provider to perform a traditional examination. This may limit your provider's ability to fully assess your condition. If your provider identifies any concerns that need to be evaluated in person or the need to arrange testing (such as labs, EKG, etc.), we will make arrangements to do so. Although advances in technology are sophisticated, we cannot ensure that it will always work on either your end or our end. If the connection with a video visit is poor, the visit may have to be switched to a telephone visit. With either a video or telephone visit, we are not always able to ensure that we have a secure connection.  By engaging in this virtual visit, you consent to the provision of healthcare and authorize for your insurance to be billed (if applicable) for the services provided during this visit. Depending on your insurance coverage, you may receive a charge related to this service.  I need to obtain your verbal consent now. Are you willing to proceed with your visit today? Carol Mata has provided verbal consent on 09/17/2024 for a virtual visit (video or telephone). Teena Shuck, [place]  Date: 09/17/2024 3:44 PM   Virtual Visit via Video Note   I, Teena Shuck, connected with  Carol Mata  ([id number], 06/06/1956) on 09/17/24 at  3:30 PM EST by a video-enabled telemedicine application and verified that I am speaking with the correct person using two identifiers.  Location: Patient: Virtual Visit Location  Patient: Home Provider: Virtual Visit Location Provider: Home Office   I discussed the limitations of evaluation and management by telemedicine and the availability of in person appointments. The patient expressed understanding and agreed to proceed.    History of Present Illness: Carol Mata is a 68 y.o. who identifies as a female who was assigned female at birth, and is being seen today for left neck pain.  HPI: Neck Pain  This is a new problem. The current episode started today. The problem occurs constantly. The problem has been gradually worsening. The pain is associated with an unknown factor. The pain is present in the anterior neck. The quality of the pain is described as burning and shooting. The pain is mild. The symptoms are aggravated by twisting. The pain is Same all the time. Associated symptoms comments: Jaw pain.    Problems:  Patient Active Problem List   Diagnosis Date Noted   Carotid artery stenosis, asymptomatic 10/16/2023   Aphasia 09/09/2023   Encounter for cosmetic surgery 09/25/2022   Tremor 08/08/2021   COVID-19 03/22/2021   Insomnia 07/23/2020   Coronary atherosclerosis 01/19/2020   Vitamin D  deficiency 01/19/2020   Well adult exam 09/28/2017   IBS (irritable bowel syndrome) 09/26/2016   Lactose intolerance 09/26/2016   Osteoporosis 09/26/2016   Vertigo    Atrophic vaginitis    Hypothyroidism     Allergies:  Allergies  Allergen Reactions   Lactose Intolerance (Gi)    Other Diarrhea   Medications:  Current Outpatient Medications:  Cholecalciferol (SM VITAMIN D3) 100 MCG (4000 UT) CAPS, Take 1 capsule (4,000 Units total) by mouth daily in the afternoon., Disp: , Rfl:    Cyanocobalamin  (VITAMIN B-12) 1000 MCG SUBL, Place 1 tablet (1,000 mcg total) under the tongue every other day., Disp: 100 tablet, Rfl: 3   fluticasone  (FLONASE ) 50 MCG/ACT nasal spray, Place 2 sprays into both nostrils as needed., Disp: 16 g, Rfl: 4   levothyroxine  (SYNTHROID )  88 MCG tablet, Take 1 tablet (88 mcg total) by mouth daily before breakfast. Keep May appt for future refills, Disp: 90 tablet, Rfl: 3   rosuvastatin  (CRESTOR ) 5 MG tablet, Take 0.5 tablets (2.5 mg total) by mouth daily., Disp: 45 tablet, Rfl: 3   zolpidem  (AMBIEN ) 5 MG tablet, TAKE ONE TABLET AT BEDTIME AS NEEDED FOR SLEEP, Disp: 30 tablet, Rfl: 3  Observations/Objective: Patient is well-developed, well-nourished in no acute distress.  Resting comfortably  at home.  Head is normocephalic, atraumatic.  No labored breathing.  Speech is clear and coherent with logical content.  Patient is alert and oriented at baseline.    Assessment and Plan: 1. Neck pain (Primary)  Patient presenting with anterior right neck pain and some radiation to her right jaw. Considered torticollis or early viral pharyngitis with patietn sharing the symptoms do not feel like those. Given her history of carotid artery stenosis, I advised the patient to present to the ER for a proper physical exam. There isnt any obvious swelling noted on camera however I thought this was in the best interest of the patient. She agreed to this and verbalized understanding.   Follow Up Instructions: I discussed the assessment and treatment plan with the patient. The patient was provided an opportunity to ask questions and all were answered. The patient agreed with the plan and demonstrated an understanding of the instructions.  A copy of instructions were sent to the patient via MyChart unless otherwise noted below.    The patient was advised to call back or seek an in-person evaluation if the symptoms worsen or if the condition fails to improve as anticipated.    Teena Shuck, PA-C

## 2024-09-28 ENCOUNTER — Other Ambulatory Visit

## 2024-10-18 ENCOUNTER — Ambulatory Visit (HOSPITAL_BASED_OUTPATIENT_CLINIC_OR_DEPARTMENT_OTHER)
Admission: RE | Admit: 2024-10-18 | Discharge: 2024-10-18 | Disposition: A | Discharge status: Home / Self Care | Source: Ambulatory Visit | Attending: Obstetrics & Gynecology | Admitting: Obstetrics & Gynecology

## 2024-10-18 DIAGNOSIS — M8580 Other specified disorders of bone density and structure, unspecified site: Secondary | ICD-10-CM | POA: Diagnosis present

## 2024-10-18 DIAGNOSIS — Z780 Asymptomatic menopausal state: Secondary | ICD-10-CM | POA: Insufficient documentation

## 2024-11-14 ENCOUNTER — Other Ambulatory Visit: Payer: Self-pay | Admitting: Internal Medicine

## 2024-11-22 ENCOUNTER — Other Ambulatory Visit: Payer: Self-pay | Admitting: Internal Medicine

## 2024-11-25 NOTE — Telephone Encounter (Signed)
"   Called left a message to patient   Patient needs an appointment to continue with refills  90 supply x1  with no refills has been sent to mail  "

## 2024-11-28 ENCOUNTER — Ambulatory Visit: Admitting: Obstetrics & Gynecology

## 2025-01-09 ENCOUNTER — Ambulatory Visit: Admitting: Obstetrics & Gynecology

## 2025-02-27 ENCOUNTER — Ambulatory Visit: Admitting: Internal Medicine

## 2025-04-03 ENCOUNTER — Encounter: Admitting: Internal Medicine
